# Patient Record
Sex: Male | Born: 1980 | Race: White | Hispanic: No | Marital: Married | Smoking: Current every day smoker
Health system: Southern US, Community
[De-identification: ages and names within clinical notes are randomized; demographics above are authoritative.]

## PROBLEM LIST (undated history)

## (undated) DIAGNOSIS — Z91018 Allergy to other foods: Secondary | ICD-10-CM

## (undated) DIAGNOSIS — Z789 Other specified health status: Secondary | ICD-10-CM

## (undated) HISTORY — DX: Other specified health status: Z78.9

## (undated) HISTORY — PX: CYSTECTOMY: SUR359

---

## 2004-08-11 ENCOUNTER — Ambulatory Visit: Payer: Self-pay | Admitting: Family Medicine

## 2005-03-10 ENCOUNTER — Ambulatory Visit: Payer: Self-pay | Admitting: Family Medicine

## 2005-03-18 ENCOUNTER — Ambulatory Visit: Payer: Self-pay | Admitting: Family Medicine

## 2006-02-01 ENCOUNTER — Ambulatory Visit: Payer: Self-pay | Admitting: Family Medicine

## 2006-02-08 ENCOUNTER — Ambulatory Visit: Payer: Self-pay | Admitting: Family Medicine

## 2006-03-02 ENCOUNTER — Ambulatory Visit: Payer: Self-pay | Admitting: Family Medicine

## 2006-07-05 ENCOUNTER — Ambulatory Visit: Payer: Self-pay | Admitting: Family Medicine

## 2006-08-09 ENCOUNTER — Ambulatory Visit: Payer: Self-pay | Admitting: Physician Assistant

## 2006-08-16 ENCOUNTER — Ambulatory Visit: Payer: Self-pay | Admitting: Family Medicine

## 2008-11-09 ENCOUNTER — Emergency Department (HOSPITAL_COMMUNITY): Admission: EM | Admit: 2008-11-09 | Discharge: 2008-11-09 | Payer: Self-pay | Admitting: Emergency Medicine

## 2011-03-29 ENCOUNTER — Emergency Department (HOSPITAL_COMMUNITY)
Admission: EM | Admit: 2011-03-29 | Discharge: 2011-03-29 | Disposition: A | Discharge status: Home / Self Care | Payer: Self-pay | Attending: Emergency Medicine | Admitting: Emergency Medicine

## 2011-03-29 DIAGNOSIS — R42 Dizziness and giddiness: Secondary | ICD-10-CM | POA: Insufficient documentation

## 2011-03-29 DIAGNOSIS — T148 Other injury of unspecified body region: Secondary | ICD-10-CM | POA: Insufficient documentation

## 2011-03-29 DIAGNOSIS — R110 Nausea: Secondary | ICD-10-CM | POA: Insufficient documentation

## 2011-03-29 DIAGNOSIS — E860 Dehydration: Secondary | ICD-10-CM | POA: Insufficient documentation

## 2011-03-29 DIAGNOSIS — W57XXXA Bitten or stung by nonvenomous insect and other nonvenomous arthropods, initial encounter: Secondary | ICD-10-CM | POA: Insufficient documentation

## 2011-03-29 LAB — GLUCOSE, CAPILLARY: Glucose-Capillary: 70 mg/dL (ref 70–99)

## 2012-03-03 ENCOUNTER — Encounter (HOSPITAL_COMMUNITY): Payer: Self-pay | Admitting: *Deleted

## 2012-03-03 ENCOUNTER — Emergency Department (HOSPITAL_COMMUNITY)
Admission: EM | Admit: 2012-03-03 | Discharge: 2012-03-04 | Disposition: A | Discharge status: Home / Self Care | Payer: Self-pay | Attending: Emergency Medicine | Admitting: Emergency Medicine

## 2012-03-03 ENCOUNTER — Emergency Department (HOSPITAL_COMMUNITY): Payer: Self-pay

## 2012-03-03 DIAGNOSIS — R42 Dizziness and giddiness: Secondary | ICD-10-CM | POA: Insufficient documentation

## 2012-03-03 DIAGNOSIS — R51 Headache: Secondary | ICD-10-CM | POA: Insufficient documentation

## 2012-03-03 LAB — URINALYSIS, ROUTINE W REFLEX MICROSCOPIC
Nitrite: NEGATIVE
Specific Gravity, Urine: 1.01 (ref 1.005–1.030)
Urobilinogen, UA: 0.2 mg/dL (ref 0.0–1.0)

## 2012-03-03 LAB — BASIC METABOLIC PANEL
GFR calc Af Amer: >90 mL/min (ref >90)
GFR calc non Af Amer: >90 mL/min (ref >90)
Potassium: 3.9 mEq/L (ref 3.5–5.1)
Sodium: 139 mEq/L (ref 135–145)

## 2012-03-03 LAB — DIFFERENTIAL
Basophils Absolute: 0 10*3/uL (ref 0.0–0.1)
Basophils Relative: 0 % (ref 0–1)
Eosinophils Absolute: 0 10*3/uL (ref 0.0–0.7)
Neutrophils Relative %: 65 % (ref 43–77)

## 2012-03-03 LAB — CBC
MCH: 31 pg (ref 26.0–34.0)
MCHC: 33.9 g/dL (ref 30.0–36.0)
Platelets: 232 10*3/uL (ref 150–400)
WBC: 9.1 10*3/uL (ref 4.0–10.5)

## 2012-03-03 NOTE — ED Provider Notes (Signed)
History     CSN: [id number]  Arrival date & time 03/03/12  2040   First MD Initiated Contact with Patient 03/03/12 2231      Chief Complaint  Patient presents with  . Dizziness    (Consider location/radiation/quality/duration/timing/severity/associated sxs/prior treatment) HPI Comments: Patient c/o intermittent dizziness for one year.  States he feels a sense of movement with change of position and sometimes feels "spinning" sensation with rest.  States he was seen at his PMD's office earlier this week for same and "they drew my blood" but states he does not know the results.  Yesterday, he reports having a sudden onset of headache to both temples and behind his eyes.  Describes the headache as throbbing and radiates to the back of his head,  and persistent since onset. He denies visual changes, weakness, numbness, fever, neck pain or stiffness, vomiting or difficulty standing.  He also denies any other evaluations for the dizziness  Patient is a 31 y.o. male presenting with neurologic complaint. The history is provided by the patient.  Neurologic Problem The primary symptoms include headaches and dizziness. Primary symptoms do not include syncope, loss of consciousness, altered mental status, seizures, visual change, paresthesias, focal weakness, loss of sensation, speech change, memory loss, fever, nausea or vomiting. The symptoms began yesterday. The symptoms are unchanged. The neurological symptoms are diffuse.  The headache began yesterday. The headache developed suddenly. Headache is a new problem. The headache is present continuously. Location/region(s) of the headache: bilateral (behind both eyes). The headache is associated with nothing. The headache is not associated with photophobia, eye pain, visual change, neck stiffness, paresthesias, weakness or loss of balance.  He describes the dizziness as a sensation of spinning and lightheadedness. The dizziness began more than 1 week ago. The  dizziness has been unchanged since its onset. Chronicity: intermittent. Associated with: movement. Dizziness does not occur with blurred vision, tinnitus, hearing loss, nausea, vomiting, weakness or diaphoresis.  Additional symptoms do not include neck stiffness, weakness, pain, lower back pain, leg pain, loss of balance, photophobia, hallucinations, nystagmus, taste disturbance, tinnitus, vertigo, anxiety or irritability. Medical issues do not include seizures, diabetes, hypertension or recent surgery. Workup history does not include MRI, CT scan, EEG, cerebral angiography, lumbar puncture, carotid ultrasound or cardiac workup. Procedure history comments: laboratory studies.    History reviewed. No pertinent past medical history.  History reviewed. No pertinent past surgical history.  No family history on file.  History  Substance Use Topics  . Smoking status: Current Everyday Smoker -- 0.5 packs/day  . Smokeless tobacco: Not on file  . Alcohol Use: No      Review of Systems  Constitutional: Negative for fever, diaphoresis, activity change, appetite change and irritability.  HENT: Negative for facial swelling, trouble swallowing, neck pain, neck stiffness and tinnitus.   Eyes: Negative for blurred vision, photophobia, pain and visual disturbance.  Respiratory: Negative for chest tightness and shortness of breath.   Cardiovascular: Negative for syncope.  Gastrointestinal: Negative for nausea, vomiting and abdominal pain.  Genitourinary: Negative for flank pain.  Musculoskeletal: Negative for back pain and gait problem.  Skin: Negative for color change.  Neurological: Positive for dizziness, light-headedness and headaches. Negative for vertigo, speech change, focal weakness, seizures, loss of consciousness, syncope, facial asymmetry, speech difficulty, weakness, numbness, paresthesias and loss of balance.  Psychiatric/Behavioral: Negative for hallucinations, memory loss, confusion,  decreased concentration and altered mental status.  All other systems reviewed and are negative.    Allergies  Review of  patient's allergies indicates no known allergies.  Home Medications  No current outpatient prescriptions on file.  BP 150/97  Pulse 92  Temp 98.2 F (36.8 C)  Resp 20  Ht 6' (1.829 m)  Wt 135 lb (61.236 kg)  BMI 18.31 kg/m2  SpO2 100%  Physical Exam  Nursing note and vitals reviewed. Constitutional: He is oriented to person, place, and time. He appears well-developed and well-nourished. No distress.  HENT:  Head: Normocephalic and atraumatic.  Right Ear: Tympanic membrane and ear canal normal. No mastoid tenderness. No hemotympanum.  Left Ear: Tympanic membrane and ear canal normal. No mastoid tenderness. No hemotympanum.  Mouth/Throat: Uvula is midline, oropharynx is clear and moist and mucous membranes are normal.  Eyes: Conjunctivae and EOM are normal. Pupils are equal, round, and reactive to light.  Neck: Normal range of motion. Neck supple.  Cardiovascular: Normal rate, regular rhythm, normal heart sounds and intact distal pulses.   No murmur heard. Pulmonary/Chest: Effort normal and breath sounds normal. No respiratory distress. He exhibits no tenderness.  Abdominal: Soft. Bowel sounds are normal. He exhibits no distension. There is no tenderness.  Musculoskeletal: He exhibits no edema and no tenderness.  Lymphadenopathy:    He has no cervical adenopathy.  Neurological: He is alert and oriented to person, place, and time. No cranial nerve deficit or sensory deficit. He exhibits normal muscle tone. He displays no seizure activity. Coordination and gait normal.  Reflex Scores:      Tricep reflexes are 2+ on the right side and 2+ on the left side.      Bicep reflexes are 2+ on the right side and 2+ on the left side.      Brachioradialis reflexes are 2+ on the right side and 2+ on the left side.      Patellar reflexes are 2+ on the right side and 2+ on  the left side.      Achilles reflexes are 2+ on the right side and 2+ on the left side. Skin: Skin is warm and dry.  Psychiatric: He has a normal mood and affect.    ED Course  Procedures (including critical care time)  Results for orders placed during the hospital encounter of 03/03/12  CBC      Component Value Range   WBC 9.1  4.0 - 10.5 (K/uL)   RBC 5.03  4.22 - 5.81 (MIL/uL)   Hemoglobin 15.6  13.0 - 17.0 (g/dL)   HCT 45.4  09.8 - 11.9 (%)   MCV 91.5  78.0 - 100.0 (fL)   MCH 31.0  26.0 - 34.0 (pg)   MCHC 33.9  30.0 - 36.0 (g/dL)   RDW 14.7  82.9 - 56.2 (%)   Platelets 232  150 - 400 (K/uL)  DIFFERENTIAL      Component Value Range   Neutrophils Relative 65  43 - 77 (%)   Neutro Abs 5.9  1.7 - 7.7 (K/uL)   Lymphocytes Relative 26  12 - 46 (%)   Lymphs Abs 2.3  0.7 - 4.0 (K/uL)   Monocytes Relative 9  3 - 12 (%)   Monocytes Absolute 0.8  0.1 - 1.0 (K/uL)   Eosinophils Relative 0  0 - 5 (%)   Eosinophils Absolute 0.0  0.0 - 0.7 (K/uL)   Basophils Relative 0  0 - 1 (%)   Basophils Absolute 0.0  0.0 - 0.1 (K/uL)  BASIC METABOLIC PANEL      Component Value Range   Sodium 139  135 - 145 (mEq/L)   Potassium 3.9  3.5 - 5.1 (mEq/L)   Chloride 101  96 - 112 (mEq/L)   CO2 28  19 - 32 (mEq/L)   Glucose, Bld 83  70 - 99 (mg/dL)   BUN 7  6 - 23 (mg/dL)   Creatinine, Ser 8.11  0.50 - 1.35 (mg/dL)   Calcium 9.6  8.4 - 91.4 (mg/dL)   GFR calc non Af Amer >90  >90 (mL/min)   GFR calc Af Amer >90  >90 (mL/min)  URINALYSIS, ROUTINE W REFLEX MICROSCOPIC      Component Value Range   Color, Urine YELLOW  YELLOW    APPearance CLEAR  CLEAR    Specific Gravity, Urine 1.010  1.005 - 1.030    pH 7.5  5.0 - 8.0    Glucose, UA NEGATIVE  NEGATIVE (mg/dL)   Hgb urine dipstick NEGATIVE  NEGATIVE    Bilirubin Urine NEGATIVE  NEGATIVE    Ketones, ur NEGATIVE  NEGATIVE (mg/dL)   Protein, ur NEGATIVE  NEGATIVE (mg/dL)   Urobilinogen, UA 0.2  0.0 - 1.0 (mg/dL)   Nitrite NEGATIVE  NEGATIVE     Leukocytes, UA NEGATIVE  NEGATIVE      Ct Head Wo Contrast  03/03/2012  *RADIOLOGY REPORT*  Clinical Data: Dizziness for the past year.  Blurry vision for the past day.  CT HEAD WITHOUT CONTRAST  Technique:  Contiguous axial images were obtained from the base of the skull through the vertex without contrast.  Comparison: Head CT 01/29/2008.  Findings: No acute intracranial abnormalities.  Specifically, no evidence of acute/subacute cerebral ischemia, no acute intracranial hemorrhage, no focal mass, mass effect, hydrocephalus or abnormal intra or extra-axial fluid collections.  Visualized paranasal sinuses and mastoids are well pneumatized.  No acute displaced skull fractures are identified.  IMPRESSION: 1.  No acute intracranial abnormalities. 2.  The appearance of brain is normal.  Original Report Authenticated By: Florencia Reasons, M.D.      MDM     Previous medical charts, nursing notes and vitals signs from this visit were reviewed by me   All laboratory results and/or imaging results performed on this visit, if applicable, were reviewed by me and discussed with the patient and/or parent as well as recommendation for follow-up    MEDICATIONS GIVEN IN ED:  none  Patient is resting comfortably, NAD.  Orthostatic vitals signs were reviewed by me and wnml.  No focal neuro deficits on exam.  Ambulates in the dept w/o difficulty.  Patient also seen by the EDP and care plan was discussed.  Headache was sudden onset but not described as severe or explosive in onset.  Patient agrees to f/u with his PMD for recheck.      PRESCRIPTIONS GIVEN AT DISCHARGE: meclizine   Pt stable in ED with no significant deterioration in condition. Pt feels improved after observation and/or treatment in ED. Patient / Family / Caregiver understand and agree with initial ED impression and plan with expectations set for ED visit.  Patient agrees to return to ED for any worsening symptoms       Tyquavious Gamel L.  Ulrick Methot, [place] 03/04/12 0021

## 2012-03-03 NOTE — ED Provider Notes (Signed)
Pt well appearing, no distress, reports headache started yesterday but never got to a severe level.  He is well appearing, ambulatory, no focal neuro deficits, doubt SAH at this time  Joya Gaskins, MD 03/03/12 2333

## 2012-03-03 NOTE — ED Notes (Signed)
C/o dizziness for a year, blurred vision and headache onset yesterday

## 2012-03-04 MED ORDER — MECLIZINE HCL 25 MG PO TABS: 25.0000 mg | ORAL_TABLET | Freq: Three times a day (TID) | ORAL | Status: AC | PRN

## 2012-03-04 MED ORDER — OXYCODONE-ACETAMINOPHEN 5-325 MG PO TABS
1.0000 | ORAL_TABLET | Freq: Once | ORAL | Status: AC
  Administered 2012-03-04: 1 via ORAL
  Filled 2012-03-04: qty 1

## 2012-03-04 NOTE — ED Provider Notes (Signed)
Medical screening examination/treatment/procedure(s) were conducted as a shared visit with non-physician practitioner(s) and myself.  I personally evaluated the patient during the encounter   Joya Gaskins, MD 03/04/12 [phone]

## 2012-03-04 NOTE — ED Notes (Signed)
Pt alert & oriented x4, stable gait. Pt given discharge instructions, paperwork & prescription(s). Patient instructed to stop at the registration desk to finish any additional paperwork. pt verbalized understanding. Pt left department w/ no further questions.  

## 2012-03-04 NOTE — Discharge Instructions (Signed)
Dizziness Dizziness is a common problem. It is a feeling of unsteadiness or lightheadedness. You may feel like you are about to faint. Dizziness can lead to injury if you stumble or fall. A person of any age group can suffer from dizziness, but dizziness is more common in older adults. CAUSES  Dizziness can be caused by many different things, including:  Middle ear problems.   Standing for too long.   Infections.   An allergic reaction.   Aging.   An emotional response to something, such as the sight of blood.   Side effects of medicines.   Fatigue.   Problems with circulation or blood pressure.   Excess use of alcohol, medicines, or illegal drug use.   Breathing too fast (hyperventilation).   An arrhythmia or problems with your heart rhythm.   Low red blood cell count (anemia).   Pregnancy.   Vomiting, diarrhea, fever, or other illnesses that cause dehydration.   Diseases or conditions such as Parkinson's disease, high blood pressure (hypertension), diabetes, and thyroid problems.   Exposure to extreme heat.  DIAGNOSIS  To find the cause of your dizziness, your caregiver may do a physical exam, lab tests, radiologic imaging scans, or an electrocardiography test (ECG).  TREATMENT  Treatment of dizziness depends on the cause of your symptoms and can vary greatly. HOME CARE INSTRUCTIONS   Drink enough fluids to keep your urine clear or pale yellow. This is especially important in very hot weather. In the elderly, it is also important in cold weather.   If your dizziness is caused by medicines, take them exactly as directed. When taking blood pressure medicines, it is especially important to get up slowly.   Rise slowly from chairs and steady yourself until you feel okay.   In the morning, first sit up on the side of the bed. When this seems okay, stand slowly while holding onto something until you know your balance is fine.   If you need to stand in one place for a  long time, be sure to move your legs often. Tighten and relax the muscles in your legs while standing.   If dizziness continues to be a problem, have someone stay with you for a day or two. Do this until you feel you are well enough to stay alone. Have the person call your caregiver if he or she notices changes in you that are concerning.   Do not drive or use heavy machinery if you feel dizzy.  SEEK IMMEDIATE MEDICAL CARE IF:   Your dizziness or lightheadedness gets worse.   You feel nauseous or vomit.   You develop problems with talking, walking, weakness, or using your arms, hands, or legs.   You are not thinking clearly or you have difficulty forming sentences. It may take a friend or family member to determine if your thinking is normal.   You develop chest pain, abdominal pain, shortness of breath, or sweating.   Your vision changes.   You notice any bleeding.   You have side effects from medicine that seems to be getting worse rather than better.  MAKE SURE YOU:   Understand these instructions.   Will watch your condition.   Will get help right away if you are not doing well or get worse.  Document Released: 03/10/2001 Document Revised: 09/03/2011 Document Reviewed: 04/03/2011 [employer] Patient Information 2012 [employer].Benign Positional Vertigo Vertigo means you feel like you or your surroundings are moving when they are not. Benign positional vertigo   is the most common form of vertigo. Benign means that the cause of your condition is not serious. Benign positional vertigo is more common in older adults. CAUSES  Benign positional vertigo is the result of an upset in the labyrinth system. This is an area in the middle ear that helps control your balance. This may be caused by a viral infection, head injury, or repetitive motion. However, often no specific cause is found. SYMPTOMS  Symptoms of benign positional vertigo occur when you move your head or eyes in different  directions. Some of the symptoms may include:  Loss of balance and falls.   Vomiting.   Blurred vision.   Dizziness.   Nausea.   Involuntary eye movements (nystagmus).  DIAGNOSIS  Benign positional vertigo is usually diagnosed by physical exam. If the specific cause of your benign positional vertigo is unknown, your caregiver may perform imaging tests, such as magnetic resonance imaging (MRI) or computed tomography (CT). TREATMENT  Your caregiver may recommend movements or procedures to correct the benign positional vertigo. Medicines such as meclizine, benzodiazepines, and medicines for nausea may be used to treat your symptoms. In rare cases, if your symptoms are caused by certain conditions that affect the inner ear, you may need surgery. HOME CARE INSTRUCTIONS   Follow your caregiver's instructions.   Move slowly. Do not make sudden body or head movements.   Avoid driving.   Avoid operating heavy machinery.   Avoid performing any tasks that would be dangerous to you or others during a vertigo episode.   Drink enough fluids to keep your urine clear or pale yellow.  SEEK IMMEDIATE MEDICAL CARE IF:   You develop problems with walking, weakness, numbness, or using your arms, hands, or legs.   You have difficulty speaking.   You develop severe headaches.   Your nausea or vomiting continues or gets worse.   You develop visual changes.   Your family or friends notice any behavioral changes.   Your condition gets worse.   You have a fever.   You develop a stiff neck or sensitivity to light.  MAKE SURE YOU:   Understand these instructions.   Will watch your condition.   Will get help right away if you are not doing well or get worse.  Document Released: 06/22/2006 Document Revised: 09/03/2011 Document Reviewed: 06/04/2011 [employer] Patient Information 2012 [employer]. 

## 2012-08-13 ENCOUNTER — Emergency Department (HOSPITAL_COMMUNITY)
Admission: EM | Admit: 2012-08-13 | Discharge: 2012-08-13 | Disposition: A | Discharge status: Home / Self Care | Payer: Self-pay | Attending: Emergency Medicine | Admitting: Emergency Medicine

## 2012-08-13 ENCOUNTER — Encounter (HOSPITAL_COMMUNITY): Payer: Self-pay

## 2012-08-13 DIAGNOSIS — K0889 Other specified disorders of teeth and supporting structures: Secondary | ICD-10-CM

## 2012-08-13 DIAGNOSIS — J029 Acute pharyngitis, unspecified: Secondary | ICD-10-CM | POA: Insufficient documentation

## 2012-08-13 DIAGNOSIS — K089 Disorder of teeth and supporting structures, unspecified: Secondary | ICD-10-CM | POA: Insufficient documentation

## 2012-08-13 DIAGNOSIS — F17200 Nicotine dependence, unspecified, uncomplicated: Secondary | ICD-10-CM | POA: Insufficient documentation

## 2012-08-13 LAB — RAPID STREP SCREEN (MED CTR MEBANE ONLY): Streptococcus, Group A Screen (Direct): NEGATIVE

## 2012-08-13 MED ORDER — AMOXICILLIN 500 MG PO CAPS: 500.0000 mg | ORAL_CAPSULE | Freq: Three times a day (TID) | ORAL | Status: DC

## 2012-08-13 MED ORDER — HYDROCODONE-ACETAMINOPHEN 5-325 MG PO TABS: ORAL_TABLET | ORAL | Status: DC

## 2012-08-13 NOTE — ED Notes (Signed)
Pt reports sore throat and ?toothache to left lower mouth

## 2012-08-15 NOTE — ED Provider Notes (Signed)
History     CSN: [id number]  Arrival date & time 08/13/12  1436   First MD Initiated Contact with Patient 08/13/12 1509      Chief Complaint  Patient presents with  . Sore Throat  . Dental Pain    (Consider location/radiation/quality/duration/timing/severity/associated sxs/prior treatment) Patient is a 31 y.o. male presenting with pharyngitis and tooth pain. The history is provided by the patient.  Sore Throat This is a new problem. The current episode started in the past 7 days. The problem occurs constantly. The problem has been unchanged. Associated symptoms include a sore throat. Pertinent negatives include no arthralgias, congestion, coughing, fever, headaches, neck pain, numbness, rash, swollen glands, visual change, vomiting or weakness. Associated symptoms comments: Dental pain. Exacerbated by: chewing  He has tried nothing for the symptoms. The treatment provided no relief.  Dental PainThe primary symptoms include sore throat. Primary symptoms do not include headaches, fever or cough.  The sore throat is not accompanied by trouble swallowing.  Additional symptoms do not include: facial swelling, trouble swallowing and swollen glands.    History reviewed. No pertinent past medical history.  History reviewed. No pertinent past surgical history.  No family history on file.  History  Substance Use Topics  . Smoking status: Current Every Day Smoker -- 0.5 packs/day    Types: Cigarettes  . Smokeless tobacco: Not on file  . Alcohol Use: No      Review of Systems  Constitutional: Negative for fever and appetite change.  HENT: Positive for sore throat and dental problem. Negative for congestion, facial swelling, trouble swallowing, neck pain and neck stiffness.   Eyes: Negative for pain and visual disturbance.  Respiratory: Negative for cough.   Gastrointestinal: Negative for vomiting.  Musculoskeletal: Negative for arthralgias.  Skin: Negative for rash.    Neurological: Negative for dizziness, facial asymmetry, weakness, numbness and headaches.  Hematological: Negative for adenopathy.  All other systems reviewed and are negative.    Allergies  Review of patient's allergies indicates no known allergies.  Home Medications   Current Outpatient Rx  Name  Route  Sig  Dispense  Refill  . FLUTICASONE PROPIONATE 50 MCG/ACT NA SUSP   Nasal   Place 2 sprays into the nose daily.         . AMOXICILLIN 500 MG PO CAPS   Oral   Take 1 capsule (500 mg total) by mouth 3 (three) times daily. For 10 days   30 capsule   0   . HYDROCODONE-ACETAMINOPHEN 5-325 MG PO TABS      Take one-two tabs po q 4-6 hrs prn pain   20 tablet   0     BP 145/82  Pulse 84  Temp 98 F (36.7 C) (Oral)  Resp 20  Ht 6' (1.829 m)  Wt 148 lb (67.132 kg)  BMI 20.07 kg/m2  SpO2 100%  Physical Exam  Nursing note and vitals reviewed. Constitutional: He is oriented to person, place, and time. He appears well-developed and well-nourished. No distress.  HENT:  Head: Normocephalic and atraumatic. No trismus in the jaw.  Right Ear: Tympanic membrane and ear canal normal.  Left Ear: Tympanic membrane and ear canal normal.  Mouth/Throat: Uvula is midline, oropharynx is clear and moist and mucous membranes are normal. Dental caries present. No dental abscesses or uvula swelling.       Multiple dental caries with mild erythema and ttp of the left lower premolars and molars.  No trismus or facial swelling  Neck:  Normal range of motion. Neck supple.  Cardiovascular: Normal rate, regular rhythm and normal heart sounds.   No murmur heard. Pulmonary/Chest: Effort normal and breath sounds normal.  Musculoskeletal: Normal range of motion.  Lymphadenopathy:    He has no cervical adenopathy.  Neurological: He is alert and oriented to person, place, and time. He exhibits normal muscle tone. Coordination normal.  Skin: Skin is warm and dry.    ED Course  Procedures  (including critical care time)  Results for orders placed during the hospital encounter of 08/13/12  RAPID STREP SCREEN      Component Value Range   Streptococcus, Group A Screen (Direct) NEGATIVE  NEGATIVE      1. Pain, dental       MDM   Vitals reviewed, pt is alert , well appearing  Multiple dental caries.  No facial swelling, trismus or dental abscess.  Pt agrees to f/u with a dentist.     Prescribed: Amoxil norco #20   Sigfredo Schreier L. Karlton Maya, [place] 08/15/12 1659

## 2012-08-15 NOTE — ED Provider Notes (Signed)
Medical screening examination/treatment/procedure(s) were performed by non-physician practitioner and as supervising physician I was immediately available for consultation/collaboration.   Haik Mahoney L Jagjit Riner, MD 08/15/12 2248 

## 2012-10-28 ENCOUNTER — Encounter (HOSPITAL_COMMUNITY): Payer: Self-pay | Admitting: *Deleted

## 2012-10-28 ENCOUNTER — Emergency Department (HOSPITAL_COMMUNITY)
Admission: EM | Admit: 2012-10-28 | Discharge: 2012-10-28 | Disposition: A | Discharge status: Home / Self Care | Payer: Self-pay | Attending: Emergency Medicine | Admitting: Emergency Medicine

## 2012-10-28 ENCOUNTER — Emergency Department (HOSPITAL_COMMUNITY): Payer: Self-pay

## 2012-10-28 DIAGNOSIS — F17200 Nicotine dependence, unspecified, uncomplicated: Secondary | ICD-10-CM | POA: Insufficient documentation

## 2012-10-28 DIAGNOSIS — R070 Pain in throat: Secondary | ICD-10-CM | POA: Insufficient documentation

## 2012-10-28 DIAGNOSIS — IMO0002 Reserved for concepts with insufficient information to code with codable children: Secondary | ICD-10-CM | POA: Insufficient documentation

## 2012-10-28 DIAGNOSIS — Z87828 Personal history of other (healed) physical injury and trauma: Secondary | ICD-10-CM | POA: Insufficient documentation

## 2012-10-28 DIAGNOSIS — K047 Periapical abscess without sinus: Secondary | ICD-10-CM | POA: Insufficient documentation

## 2012-10-28 DIAGNOSIS — K029 Dental caries, unspecified: Secondary | ICD-10-CM | POA: Insufficient documentation

## 2012-10-28 DIAGNOSIS — R1310 Dysphagia, unspecified: Secondary | ICD-10-CM | POA: Insufficient documentation

## 2012-10-28 LAB — RAPID STREP SCREEN (MED CTR MEBANE ONLY): Streptococcus, Group A Screen (Direct): NEGATIVE

## 2012-10-28 MED ORDER — AMOXICILLIN 500 MG PO CAPS: 500.0000 mg | ORAL_CAPSULE | Freq: Three times a day (TID) | ORAL | Status: AC

## 2012-10-28 MED ORDER — DEXTROSE 5 % IV SOLN
1.0000 g | Freq: Once | INTRAVENOUS | Status: AC
  Administered 2012-10-28: 1 g via INTRAVENOUS
  Filled 2012-10-28: qty 10

## 2012-10-28 MED ORDER — HYDROCODONE-ACETAMINOPHEN 5-325 MG PO TABS
2.0000 | ORAL_TABLET | Freq: Once | ORAL | Status: AC
  Administered 2012-10-28: 2 via ORAL
  Filled 2012-10-28: qty 2

## 2012-10-28 MED ORDER — IBUPROFEN 800 MG PO TABS
800.0000 mg | ORAL_TABLET | Freq: Once | ORAL | Status: AC
  Administered 2012-10-28: 800 mg via ORAL
  Filled 2012-10-28: qty 1

## 2012-10-28 MED ORDER — HYDROCODONE-ACETAMINOPHEN 5-325 MG PO TABS
1.0000 | ORAL_TABLET | Freq: Once | ORAL | Status: AC
  Administered 2012-10-28: 1 via ORAL
  Filled 2012-10-28: qty 1

## 2012-10-28 MED ORDER — IOHEXOL 300 MG/ML  SOLN
75.0000 mL | Freq: Once | INTRAMUSCULAR | Status: AC | PRN
  Administered 2012-10-28: 75 mL via INTRAVENOUS

## 2012-10-28 MED ORDER — HYDROCODONE-ACETAMINOPHEN 5-325 MG PO TABS: 1.0000 | ORAL_TABLET | ORAL | Status: DC | PRN

## 2012-10-28 MED ORDER — ONDANSETRON HCL 4 MG PO TABS
4.0000 mg | ORAL_TABLET | Freq: Once | ORAL | Status: AC
  Administered 2012-10-28: 4 mg via ORAL
  Filled 2012-10-28: qty 1

## 2012-10-28 MED ORDER — AMOXICILLIN 250 MG PO CAPS
500.0000 mg | ORAL_CAPSULE | Freq: Once | ORAL | Status: AC
  Administered 2012-10-28: 500 mg via ORAL
  Filled 2012-10-28: qty 2

## 2012-10-28 NOTE — ED Provider Notes (Signed)
History     CSN: [id number]  Arrival date & time 10/28/12  4098   First MD Initiated Contact with Patient 10/28/12 2015      No chief complaint on file.   (Consider location/radiation/quality/duration/timing/severity/associated sxs/prior treatment) HPI Comments: Pt states he has had problem with " bad teeth" for some time. He was seen in ED earlier today for toothache pain and treated with antibiotics and pain meds. He states that 3 hours ago, He began to have problem swallowing. Able to swallow yesterday, but not today. 9am started having pain. Can still get liquids down. No fever. No injury. No operations on neck or throat.  Throat pain  And pain just behind the chin. No difficulty with breathing.  The history is provided by the patient and the spouse.    History reviewed. No pertinent past medical history.  History reviewed. No pertinent past surgical history.  History reviewed. No pertinent family history.  History  Substance Use Topics  . Smoking status: Current Every Day Smoker -- 0.5 packs/day    Types: Cigarettes  . Smokeless tobacco: Not on file  . Alcohol Use: No      Review of Systems  Constitutional: Negative for activity change.       All ROS Neg except as noted in HPI  HENT: Positive for trouble swallowing and dental problem. Negative for nosebleeds and neck pain.   Eyes: Negative for photophobia and discharge.  Respiratory: Negative for cough, choking, shortness of breath, wheezing and stridor.   Cardiovascular: Negative for chest pain and palpitations.  Gastrointestinal: Negative for abdominal pain and blood in stool.  Genitourinary: Negative for dysuria, frequency and hematuria.  Musculoskeletal: Negative for back pain and arthralgias.  Skin: Negative.   Neurological: Negative for dizziness, seizures and speech difficulty.  Psychiatric/Behavioral: Negative for hallucinations and confusion.    Allergies  Review of patient's allergies indicates no  known allergies.  Home Medications   Current Outpatient Rx  Name  Route  Sig  Dispense  Refill  . AMOXICILLIN 500 MG PO CAPS   Oral   Take 1 capsule (500 mg total) by mouth 3 (three) times daily.   30 capsule   0   . HYDROCODONE-ACETAMINOPHEN 5-325 MG PO TABS      Take one-two tabs po q 4-6 hrs prn pain   20 tablet   0   . HYDROCODONE-ACETAMINOPHEN 5-325 MG PO TABS   Oral   Take 1 tablet by mouth every 4 (four) hours as needed for pain.   15 tablet   0     BP 139/74  Pulse 71  Temp 98.2 F (36.8 C) (Oral)  Resp 20  Wt 131 lb (59.421 kg)  SpO2 100%  Physical Exam  Nursing note and vitals reviewed. Constitutional: He is oriented to person, place, and time. He appears well-developed and well-nourished.  Non-toxic appearance.  HENT:  Head: Normocephalic.  Right Ear: Tympanic membrane and external ear normal.  Left Ear: Tympanic membrane and external ear normal.       Multiple pimples about the face consistent with acne. No drainage from any of the pimples. The veins are quite prominent under the tongue, but no significant swelling under the tongue appreciated. The uvula is slightly enlarged. The airway is patent. The speech is understandable.  Eyes: EOM and lids are normal. Pupils are equal, round, and reactive to light.  Neck: Normal range of motion. Neck supple. Carotid bruit is not present.       Submental  soreness noted. A few palpable lymph nodes.  Cardiovascular: Normal rate, regular rhythm, normal heart sounds, intact distal pulses and normal pulses.   Pulmonary/Chest: Breath sounds normal. No respiratory distress. He has no wheezes. He has no rales.  Abdominal: Soft. Bowel sounds are normal. There is no tenderness. There is no guarding.  Musculoskeletal: Normal range of motion.  Lymphadenopathy:       Head (right side): No submandibular adenopathy present.       Head (left side): No submandibular adenopathy present.    He has no cervical adenopathy.   Neurological: He is alert and oriented to person, place, and time. He has normal strength. No cranial nerve deficit or sensory deficit.  Skin: Skin is warm and dry.  Psychiatric: He has a normal mood and affect. His speech is normal.    ED Course  Procedures (including critical care time)  Labs Reviewed - No data to display No results found.   No diagnosis found.    MDM  I have reviewed nursing notes, vital signs, and all appropriate lab and imaging results for this patient. Strep screen negative. CT scan is read as unremarkable. Patient is treated with IV Rocephin in the event that the patient's infection is over taking the oral antibiotics being given. The patient will be asked to continue his ibuprofen his pain medication and his antibiotic. The patient is strongly encouraged to see a dentist as sone as possible. Patient invited to return to the emergency department if any additional changes, problems, or concerns.       Kathie Dike, [place] 10/29/12 [phone]

## 2012-10-28 NOTE — ED Notes (Addendum)
Pt says "trouble swallowing", seen here earlier for dental pain.  Alert, NAD

## 2012-10-28 NOTE — ED Provider Notes (Signed)
Medical screening examination/treatment/procedure(s) were performed by non-physician practitioner and as supervising physician I was immediately available for consultation/collaboration. Denym Rahimi, MD, FACEP   Kiondre Grenz L Sriram Febles, MD 10/28/12 1534 

## 2012-10-28 NOTE — ED Provider Notes (Signed)
History     CSN: [id number]  Arrival date & time 10/28/12  1350   First MD Initiated Contact with Patient 10/28/12 1439      Chief Complaint  Patient presents with  . Dental Pain    (Consider location/radiation/quality/duration/timing/severity/associated sxs/prior treatment) HPI Comments: Trevor Sosa is a 32 y.o. Male presenting with a 1 day history of dental pain and gingival swelling.   The patient has a history of injury to the tooth but also has generalized dental problems with he blames on drinking too much mountain dew.  He has a filling in his right lower 2nd molar which fell out some time ago and is now causing pain.  There has been no fevers,  Chills, nausea or vomiting, also no complaint of difficulty swallowing,  Although chewing makes pain worse.  The patient has tried tylenol without relief of symptoms.      The history is provided by the patient.    History reviewed. No pertinent past medical history.  History reviewed. No pertinent past surgical history.  History reviewed. No pertinent family history.  History  Substance Use Topics  . Smoking status: Current Every Day Smoker -- 0.5 packs/day    Types: Cigarettes  . Smokeless tobacco: Not on file  . Alcohol Use: No      Review of Systems  Constitutional: Negative for fever.  HENT: Positive for dental problem. Negative for sore throat, facial swelling, neck pain and neck stiffness.   Respiratory: Negative for shortness of breath.     Allergies  Review of patient's allergies indicates no known allergies.  Home Medications   Current Outpatient Rx  Name  Route  Sig  Dispense  Refill  . AMOXICILLIN 500 MG PO CAPS   Oral   Take 1 capsule (500 mg total) by mouth 3 (three) times daily. For 10 days   30 capsule   0   . AMOXICILLIN 500 MG PO CAPS   Oral   Take 1 capsule (500 mg total) by mouth 3 (three) times daily.   30 capsule   0   . FLUTICASONE PROPIONATE 50 MCG/ACT NA SUSP   Nasal   Place 2  sprays into the nose daily.         Marland Kitchen HYDROCODONE-ACETAMINOPHEN 5-325 MG PO TABS      Take one-two tabs po q 4-6 hrs prn pain   20 tablet   0   . HYDROCODONE-ACETAMINOPHEN 5-325 MG PO TABS   Oral   Take 1 tablet by mouth every 4 (four) hours as needed for pain.   15 tablet   0     BP 145/85  Pulse 82  Temp 98.4 F (36.9 C) (Oral)  Resp 20  Ht 6' (1.829 m)  Wt 131 lb 5 oz (59.563 kg)  BMI 17.81 kg/m2  SpO2 100%  Physical Exam  Constitutional: He is oriented to person, place, and time. He appears well-developed and well-nourished. No distress.  HENT:  Head: Normocephalic and atraumatic. No trismus in the jaw.    Right Ear: Tympanic membrane and external ear normal.  Left Ear: Tympanic membrane and external ear normal.  Mouth/Throat: Oropharynx is clear and moist and mucous membranes are normal. No oral lesions. Dental abscesses present.         Generalized dental decay and poor dental hygiene.    Eyes: Conjunctivae normal are normal.  Neck: Normal range of motion. Neck supple.  Cardiovascular: Normal rate and normal heart sounds.   Pulmonary/Chest: Effort  normal.  Abdominal: He exhibits no distension.  Musculoskeletal: Normal range of motion.  Lymphadenopathy:    He has no cervical adenopathy.  Neurological: He is alert and oriented to person, place, and time.  Skin: Skin is warm and dry. No erythema.  Psychiatric: He has a normal mood and affect.    ED Course  Procedures (including critical care time)  Labs Reviewed - No data to display No results found.   1. Dental abscess       MDM  Amoxil,  Hydrocodone,  F/u with dentist as planned,  pcp prn worse sx.        Burgess Amor, [place] 10/28/12 1514

## 2012-10-28 NOTE — ED Notes (Signed)
Rt jaw pain,,dental pain.

## 2012-10-30 NOTE — ED Provider Notes (Signed)
Medical screening examination/treatment/procedure(s) were performed by non-physician practitioner and as supervising physician I was immediately available for consultation/collaboration.   Leviathan Macera M Mayetta Castleman, DO 10/30/12 1910 

## 2013-03-17 ENCOUNTER — Encounter (HOSPITAL_COMMUNITY): Payer: Self-pay | Admitting: *Deleted

## 2013-03-17 ENCOUNTER — Emergency Department (HOSPITAL_COMMUNITY)
Admission: EM | Admit: 2013-03-17 | Discharge: 2013-03-17 | Disposition: A | Discharge status: Home / Self Care | Payer: Self-pay | Attending: Emergency Medicine | Admitting: Emergency Medicine

## 2013-03-17 DIAGNOSIS — F17200 Nicotine dependence, unspecified, uncomplicated: Secondary | ICD-10-CM | POA: Insufficient documentation

## 2013-03-17 DIAGNOSIS — F411 Generalized anxiety disorder: Secondary | ICD-10-CM | POA: Insufficient documentation

## 2013-03-17 DIAGNOSIS — R42 Dizziness and giddiness: Secondary | ICD-10-CM | POA: Insufficient documentation

## 2013-03-17 LAB — BASIC METABOLIC PANEL
Chloride: 98 mEq/L (ref 96–112)
Creatinine, Ser: 0.91 mg/dL (ref 0.50–1.35)
GFR calc Af Amer: >90 mL/min (ref >90)
GFR calc non Af Amer: >90 mL/min (ref >90)

## 2013-03-17 LAB — URINALYSIS, ROUTINE W REFLEX MICROSCOPIC
Bilirubin Urine: NEGATIVE
Ketones, ur: NEGATIVE mg/dL
Leukocytes, UA: NEGATIVE
Nitrite: NEGATIVE
Protein, ur: NEGATIVE mg/dL
Urobilinogen, UA: 0.2 mg/dL (ref 0.0–1.0)
pH: 7.5 (ref 5.0–8.0)

## 2013-03-17 LAB — CBC WITH DIFFERENTIAL/PLATELET
Basophils Absolute: 0 10*3/uL (ref 0.0–0.1)
Basophils Relative: 0 % (ref 0–1)
Eosinophils Absolute: 0 10*3/uL (ref 0.0–0.7)
HCT: 42.2 % (ref 39.0–52.0)
MCH: 30.3 pg (ref 26.0–34.0)
MCHC: 33.9 g/dL (ref 30.0–36.0)
Monocytes Absolute: 0.7 10*3/uL (ref 0.1–1.0)
Neutro Abs: 5.6 10*3/uL (ref 1.7–7.7)
Neutrophils Relative %: 70 % (ref 43–77)
RDW: 12.9 % (ref 11.5–15.5)

## 2013-03-17 NOTE — ED Notes (Signed)
Pt states he has been feeling nervous since yesterday with dizziness at times. Had the same occur two years ago and was treated for dehydration and tick bite, per pt. NAD.

## 2013-03-19 NOTE — ED Provider Notes (Signed)
History     CSN: [id number]  Arrival date & time 03/17/13  1029   First MD Initiated Contact with Patient 03/17/13 1034      Chief Complaint  Patient presents with  . Anxiety  . Dizziness    (Consider location/radiation/quality/duration/timing/severity/associated sxs/prior treatment) HPI Comments: Trevor Sosa is a 32 y.o. Male presenting with complaint of feeling nervous and having intermittent episodes of lightheadedness which are transient and triggered by standing too quickly, since yesterday afternoon.  He reports he has spent a lot of time at his home outdoors over the past several days, but not doing any exertional activities, but states the last time he felt this way he was treated for dehydration and treatment of the tick bite.  This occurred several years ago and was treated at an outside emergency department.  He denies any recent tick exposures.  He describes feeling "jittery" inside, but denies increased stress or anxiety and has had no difficulty sleeping and has maintained a good appetite.  He has been drinking plenty of fluids and has no reason to be dehydrated.  He denies fevers or rash, headache, nausea or emesis, shortness of breath chest pain, palpitations and abdominal pain.    The history is provided by the patient.    History reviewed. No pertinent past medical history.  History reviewed. No pertinent past surgical history.  No family history on file.  History  Substance Use Topics  . Smoking status: Current Every Day Smoker -- 0.50 packs/day    Types: Cigarettes  . Smokeless tobacco: Not on file  . Alcohol Use: No      Review of Systems  Constitutional: Negative for fever.  HENT: Negative for ear pain, congestion, sore throat, neck pain, tinnitus and ear discharge.   Eyes: Negative.   Respiratory: Negative for cough, chest tightness and shortness of breath.   Cardiovascular: Negative for chest pain, palpitations and leg swelling.   Gastrointestinal: Negative for nausea and abdominal pain.  Genitourinary: Negative.   Musculoskeletal: Negative for joint swelling and arthralgias.  Skin: Negative.  Negative for rash and wound.  Neurological: Positive for light-headedness. Negative for dizziness, weakness, numbness and headaches.  Psychiatric/Behavioral: Negative.  Negative for confusion and agitation. The patient is not nervous/anxious and is not hyperactive.     Allergies  Review of patient's allergies indicates no known allergies.  Home Medications  No current outpatient prescriptions on file.  BP 125/83  Pulse 81  Temp(Src) 98.5 F (36.9 C) (Oral)  Resp 16  Ht 6' (1.829 m)  Wt 130 lb (58.968 kg)  BMI 17.63 kg/m2  SpO2 100%  Physical Exam  Nursing note and vitals reviewed. Constitutional: He appears well-developed and well-nourished.  HENT:  Head: Normocephalic and atraumatic.  Right Ear: Tympanic membrane and external ear normal.  Left Ear: Tympanic membrane and external ear normal.  Mouth/Throat: Uvula is midline and oropharynx is clear and moist. Mucous membranes are not dry.  Eyes: Conjunctivae are normal.  Neck: Normal range of motion. No thyromegaly present.  Cardiovascular: Normal rate, regular rhythm, normal heart sounds and intact distal pulses.   Pulmonary/Chest: Effort normal and breath sounds normal. No respiratory distress. He has no decreased breath sounds. He has no wheezes. He has no rhonchi. He has no rales.  Abdominal: Soft. Bowel sounds are normal. There is no tenderness.  Musculoskeletal: Normal range of motion.  Neurological: He is alert.  Skin: Skin is warm and dry.  Psychiatric: He has a normal mood and affect.  ED Course  Procedures (including critical care time)  Labs Reviewed  BASIC METABOLIC PANEL - Abnormal; Notable for the following:    Glucose, Bld 105 (*)    All other components within normal limits  CBC WITH DIFFERENTIAL  URINALYSIS, ROUTINE W REFLEX  MICROSCOPIC   No results found.   1. Intermittent lightheadedness       MDM  Orthostatic vital signs were obtained and patient was not orthostatic with this procedure.  His laboratory tests were reviewed and unremarkable.  Exam is also unremarkable.  Vital signs stable during ED visit.  He tolerated by mouth fluids while here.  There is no clear medical diagnosis for patient's symptoms today.  He was given reassurance and advised to return for any worsened symptoms.  However, ideally was referred back to his PCP for further management and for recheck of his symptoms within the next week.  Patient states will call his doctor for a recheck next week.        Burgess Amor, PA-C 03/19/13 1742

## 2013-03-21 NOTE — ED Provider Notes (Signed)
Medical screening examination/treatment/procedure(s) were performed by non-physician practitioner and as supervising physician I was immediately available for consultation/collaboration.   Shelda Jakes, MD 03/21/13 [phone]

## 2013-12-01 ENCOUNTER — Encounter (HOSPITAL_COMMUNITY): Payer: Self-pay | Admitting: Emergency Medicine

## 2013-12-01 ENCOUNTER — Emergency Department (HOSPITAL_COMMUNITY)
Admission: EM | Admit: 2013-12-01 | Discharge: 2013-12-01 | Disposition: A | Discharge status: Home / Self Care | Payer: Self-pay | Attending: Emergency Medicine | Admitting: Emergency Medicine

## 2013-12-01 DIAGNOSIS — N342 Other urethritis: Secondary | ICD-10-CM | POA: Insufficient documentation

## 2013-12-01 DIAGNOSIS — N4889 Other specified disorders of penis: Secondary | ICD-10-CM | POA: Insufficient documentation

## 2013-12-01 DIAGNOSIS — F17200 Nicotine dependence, unspecified, uncomplicated: Secondary | ICD-10-CM | POA: Insufficient documentation

## 2013-12-01 LAB — URINALYSIS, ROUTINE W REFLEX MICROSCOPIC
BILIRUBIN URINE: NEGATIVE
Glucose, UA: NEGATIVE mg/dL
HGB URINE DIPSTICK: NEGATIVE
Ketones, ur: NEGATIVE mg/dL
Leukocytes, UA: NEGATIVE
Nitrite: NEGATIVE
PROTEIN: NEGATIVE mg/dL
Specific Gravity, Urine: 1.015 (ref 1.005–1.030)
UROBILINOGEN UA: 1 mg/dL (ref 0.0–1.0)
pH: 7.5 (ref 5.0–8.0)

## 2013-12-01 MED ORDER — AZITHROMYCIN 250 MG PO TABS
1000.0000 mg | ORAL_TABLET | Freq: Once | ORAL | Status: AC
  Administered 2013-12-01: 1000 mg via ORAL
  Filled 2013-12-01: qty 4

## 2013-12-01 MED ORDER — CEFTRIAXONE SODIUM 250 MG IJ SOLR
250.0000 mg | Freq: Once | INTRAMUSCULAR | Status: AC
  Administered 2013-12-01: 250 mg via INTRAMUSCULAR
  Filled 2013-12-01: qty 250

## 2013-12-01 MED ORDER — STERILE WATER FOR INJECTION IJ SOLN
INTRAMUSCULAR | Status: AC
  Administered 2013-12-01: 0.9 mL
  Filled 2013-12-01: qty 10

## 2013-12-01 NOTE — Discharge Instructions (Signed)
Urethritis, Adult Urethritis is an inflammation of the tube through which urine exits your bladder (urethra).  CAUSES Urethritis is often caused by an infection in your urethra. The infection can be viral, like herpes. The infection can also be bacterial, like gonorrhea. RISK FACTORS Risk factors of urethritis include:  Having sex without using a condom.  Having multiple sexual partners.  Having poor hygiene. SIGNS AND SYMPTOMS Symptoms of urethritis are less noticeable in women than in men. These symptoms include:  Burning feeling when you urinate (dysuria).  Discharge from your urethra.  Blood in your urine (hematuria).  Urinating more than usual. DIAGNOSIS  To confirm a diagnosis of urethritis, your health care provider will do the following:  Ask about your sexual history.  Perform a physical exam.  Have you provide a sample of your urine for lab testing.  Use a cotton swab to gently collect a sample from your urethra for lab testing. TREATMENT  It is important to treat urethritis. Depending on the cause, untreated urethritis may lead to serious genital infections and possibly infertility. Urethritis caused by a bacterial infection is treated with antibiotics. All sexual partners must be treated.  HOME CARE INSTRUCTIONS  Do not have sex until the test results are known and treatment is completed, even if your symptoms go away before you finish treatment.  Finish all medicines that you are prescribed. SEEK MEDICAL CARE IF:   Your symptoms are not improved in 3 days.  Your symptoms are getting worse.  You develop abdominal pain or pelvic pain (in women).  You develop joint pain. SEEK IMMEDIATE MEDICAL CARE IF:   You have a fever with a temperature of 101.71F (38.8C) or greater.  You have severe pain in the belly, back, or side.  You have repeated vomiting. Document Released: 03/10/2001 Document Revised: 07/05/2013 Document Reviewed: 05/15/2013 [facility]  Patient Information 2014 [employer], [place].   Followup with your primary care Dr. if not improving

## 2013-12-01 NOTE — Care Management Note (Signed)
ED/CM noted patient did not have health insurance and/or PCP listed in the computer.  Patient was given the [place] resource handout with information on the clinics, food pantries, and the handout for new health insurance sign-up.  Patient expressed appreciation for information received. 

## 2013-12-01 NOTE — ED Notes (Signed)
Dysuria,  Voiding less than usual  " once a day",   NO fever. No NVD

## 2013-12-01 NOTE — ED Provider Notes (Signed)
CSN: [id number]     Arrival date & time 12/01/13  1118 History   First MD Initiated Contact with Patient 12/01/13 1146    This chart was scribed for Donnetta HutchingBrian Jaquelyn Sakamoto, MD by Marica OtterNusrat Rahman, ED Scribe. This patient was seen in room APA09/APA09 and the patient's care was started at 11:56 AM.  Chief Complaint  Patient presents with  . Dysuria   The history is provided by the patient. No language interpreter was used.   HPI Comments: Trevor Sosa is a 33 y.o. male who presents to the Emergency Department complaining of intermittent dysuria onset a few days ago. Pt also complains of associated urinary retention. Pt describes feeling "uncomfortable" when he urinates. Pt denies any blood or mucus in his urine. Pt denies fever. Pt, however, states that there is some penile swelling. Pt denies having any history of similar symptoms.    History reviewed. No pertinent past medical history. History reviewed. No pertinent past surgical history. History reviewed. No pertinent family history. History  Substance Use Topics  . Smoking status: Current Every Day Smoker -- 0.50 packs/day    Types: Cigarettes  . Smokeless tobacco: Not on file  . Alcohol Use: No    Review of Systems  Constitutional: Negative for fever.  Genitourinary: Positive for dysuria, penile swelling and difficulty urinating.    A complete 10 system review of systems was obtained and all systems are negative except as noted in the HPI and PMH.    Allergies  Review of patient's allergies indicates no known allergies.  Home Medications   Current Outpatient Rx  Name  Route  Sig  Dispense  Refill  . acetaminophen (TYLENOL) 500 MG tablet   Oral   Take 500 mg by mouth every 6 (six) hours as needed for mild pain.          Triage Vitals: BP 121/71  Pulse 67  Temp(Src) 98.2 F (36.8 C) (Oral)  Resp 16  Ht 6' (1.829 m)  Wt 145 lb (65.772 kg)  BMI 19.66 kg/m2  SpO2 100% Physical Exam  Nursing note and vitals  reviewed. Constitutional: He is oriented to person, place, and time. He appears well-developed and well-nourished.  HENT:  Head: Normocephalic and atraumatic.  Eyes: Conjunctivae and EOM are normal. Pupils are equal, round, and reactive to light.  Neck: Normal range of motion. Neck supple.  Cardiovascular: Normal rate, regular rhythm and normal heart sounds.   Pulmonary/Chest: Effort normal and breath sounds normal.  Abdominal: Soft. Bowel sounds are normal.  Genitourinary: Penis normal.  Musculoskeletal: Normal range of motion.  Neurological: He is alert and oriented to person, place, and time.  Skin: Skin is warm and dry.  Psychiatric: He has a normal mood and affect. His behavior is normal.    ED Course  Procedures (including critical care time) DIAGNOSTIC STUDIES:  Oxygen Saturation is 100% on RA, normal by my interpretation.    COORDINATION OF CARE:  12:00 PM-Discussed treatment plan which includes UA with pt at bedside and pt agreed to plan.   Labs Review Labs Reviewed  URINALYSIS, ROUTINE W REFLEX MICROSCOPIC - Abnormal; Notable for the following:    APPearance CLOUDY (*)    All other components within normal limits   Imaging Review No results found.   EKG Interpretation None      MDM   Final diagnoses:  Urethritis   urinalysis shows no obvious infection. We'll treat for urethritis. Patient has primary care followup  I personally performed the services  described in this documentation, which was scribed in my presence. The recorded information has been reviewed and is accurate.     Donnetta Hutching, MD 12/01/13 [phone]

## 2014-02-22 IMAGING — CT CT NECK W/ CM
3 of 4 series · 12 of 33 positions shown, 14 images · IV contrast (Omnipaque 300)
Comparison: None.

CLINICAL DATA: Difficulty swallowing, right jaw/throat pain,
multiple dental caries

CT NECK WITH CONTRAST
TECHNIQUE: Multidetector CT imaging of the neck was performed with
intravenous contrast.
Contrast: 75mL OMNIPAQUE IOHEXOL 300 MG/ML  SOLN

[Series 2: soft tissue neck 2.0 b31s · axial · 0.39mm/px · z∈[+94,+260]mm · 4 of 125 slices shown, 5 images]
[im 21/125  soft-tissue]
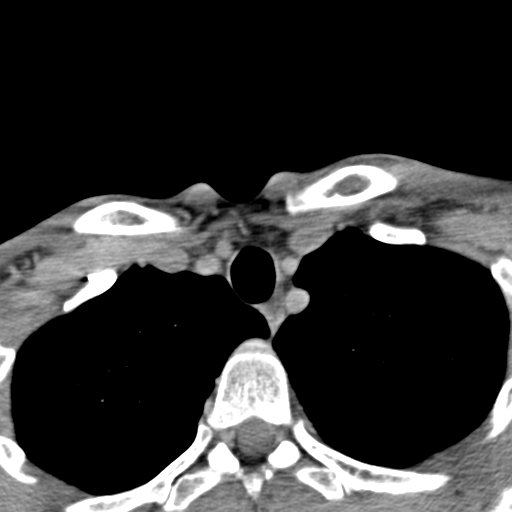
[im 21/125  bone]
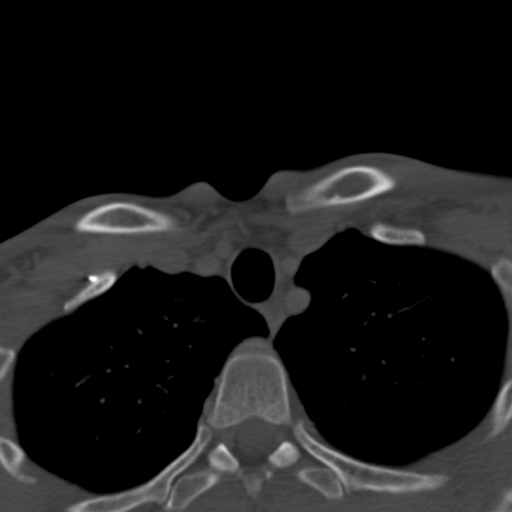
[im 42/125  bone]
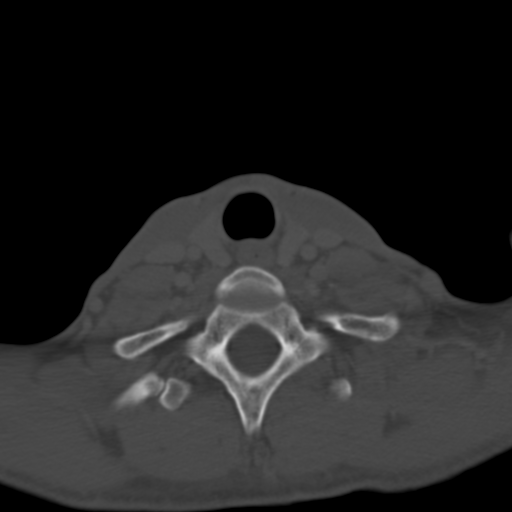
[im 83/125  bone]
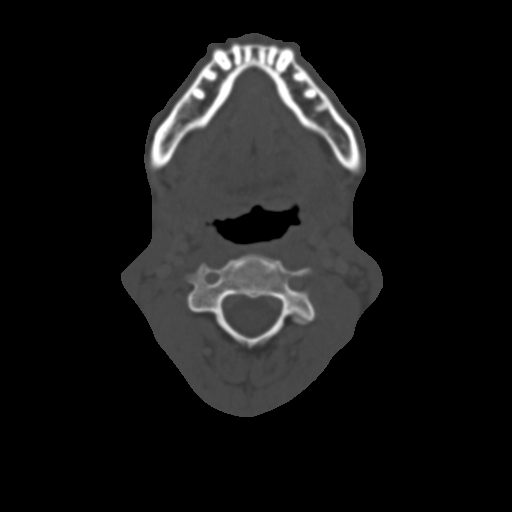
[im 104/125  bone]
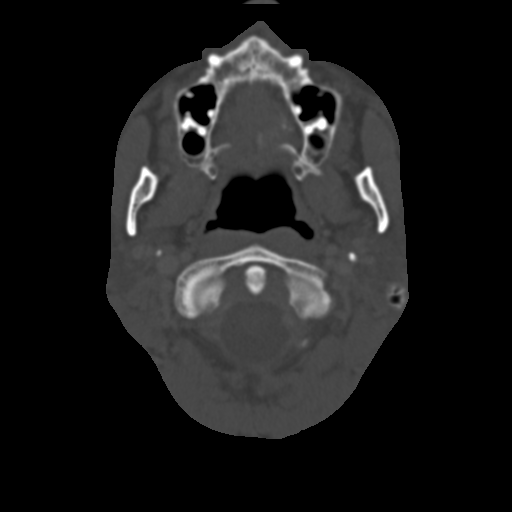

[Series 4: neck 2.0 soft tissue sag · sagittal · 0.34mm/px · 5 of 79 slices shown, 6 images]
[im 27/79  bone]
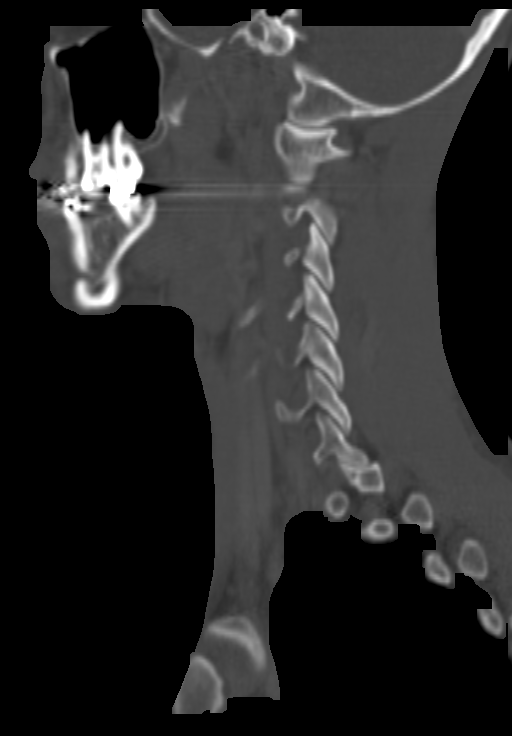
[im 33/79  bone]
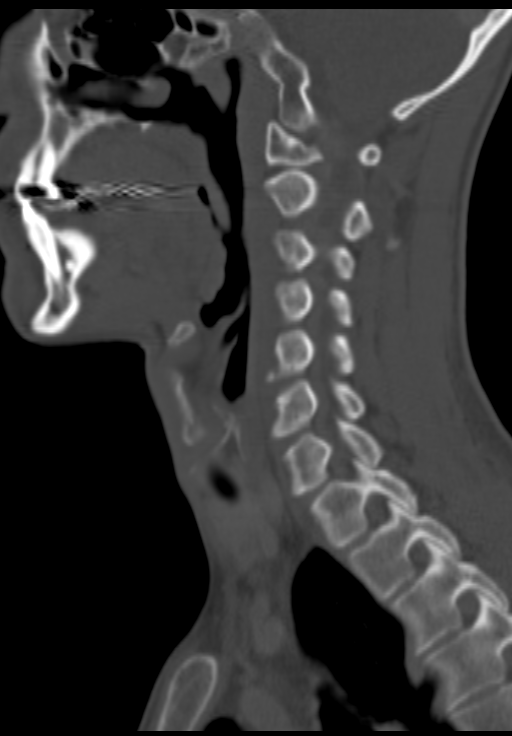
[im 40/79  soft-tissue]
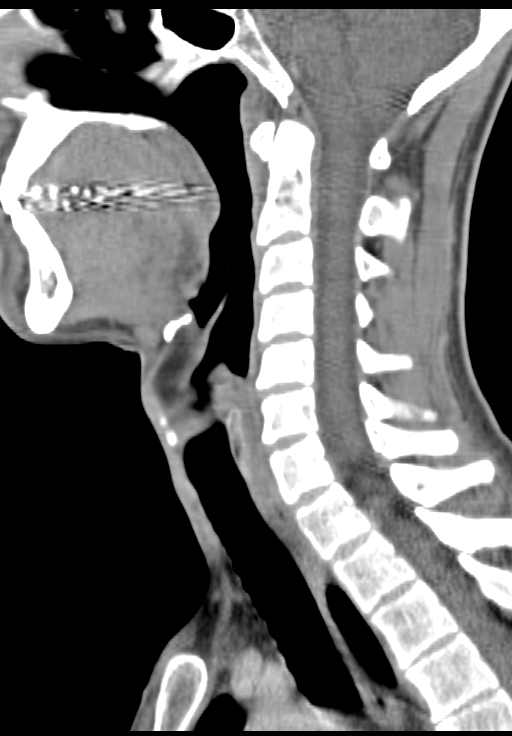
[im 40/79  bone]
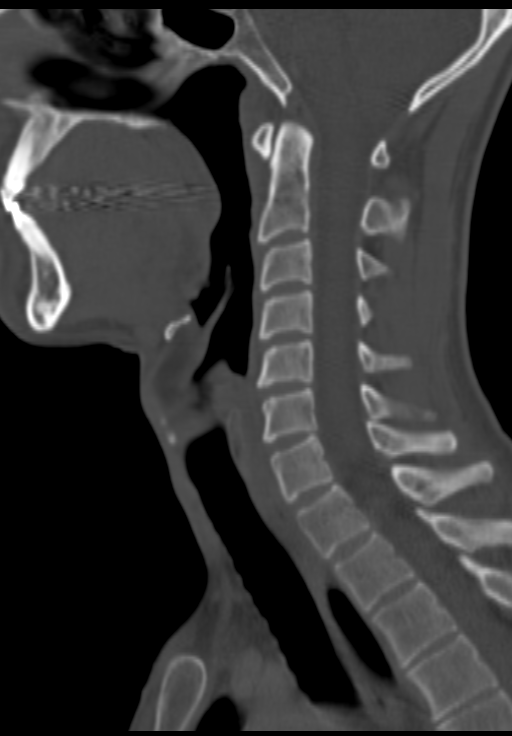
[im 46/79  bone]
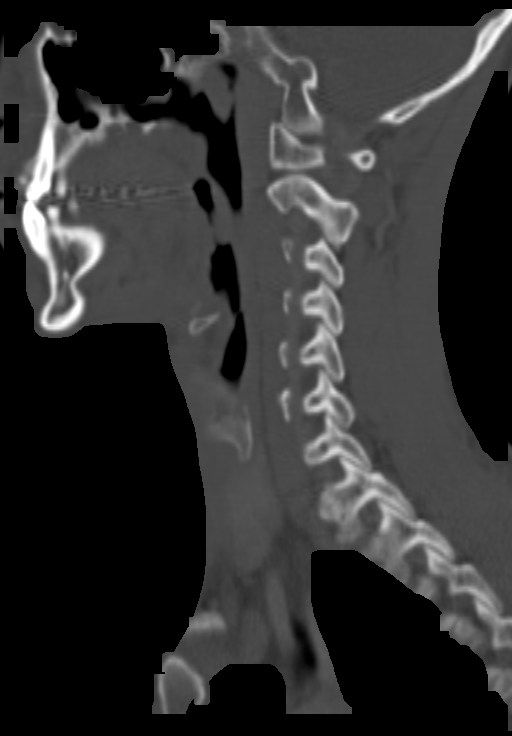
[im 53/79  bone]
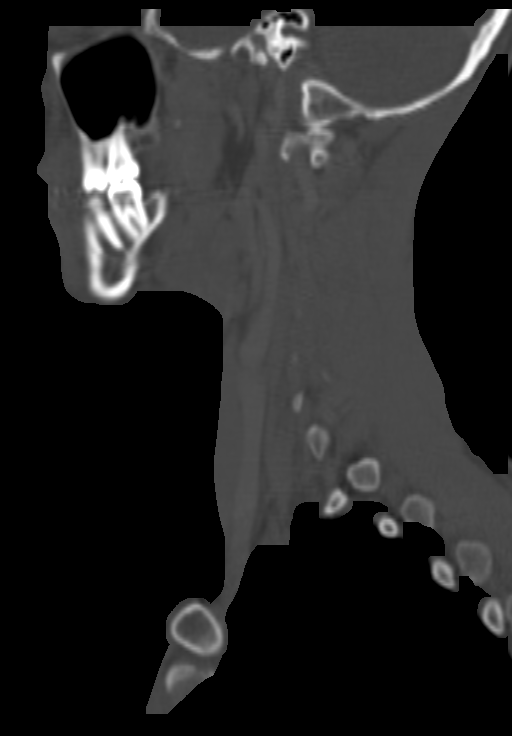

[Series 5: neck 2.0 soft tissue coro · coronal · 0.27mm/px · 3 of 90 slices shown]
[im 18/90  bone]
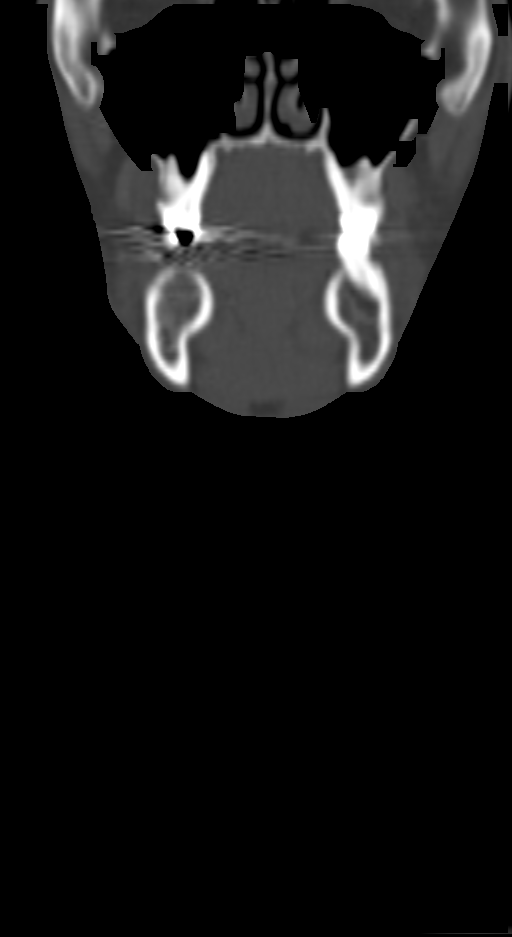
[im 36/90  bone]
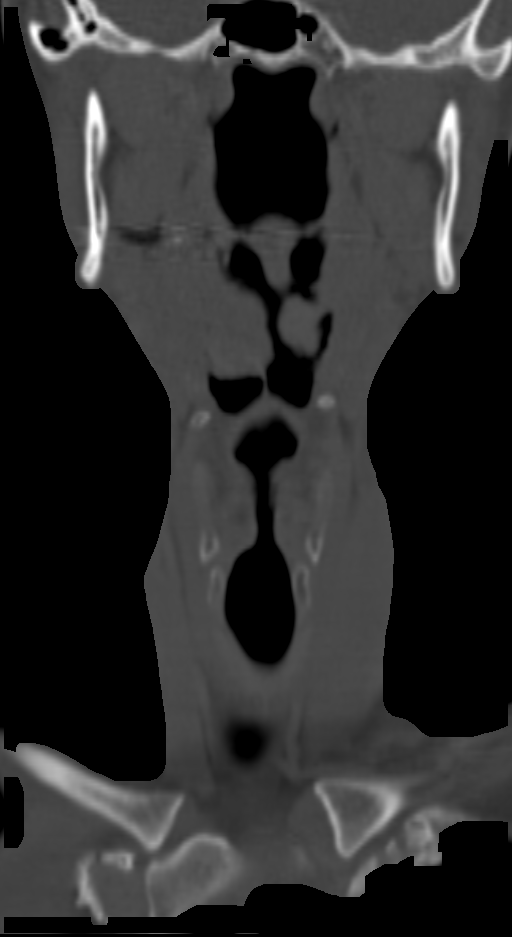
[im 54/90  bone]
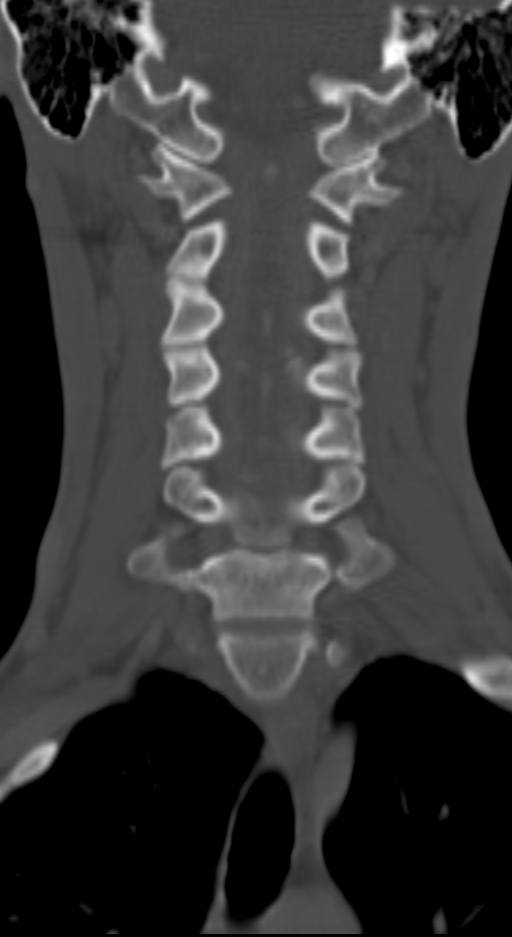

[12 of 33 positions shown; findings below may reference images not displayed]

FINDINGS: The nasopharyngeal soft tissues are unremarkable.

The airway remains patent.

No evidence of peritonsillar abscess.

Small bilateral cervical lymph nodes which do not meet pathologic
CT size criteria, likely reactive.

No evidence of odontogenic abscess.

Small cutaneous lesion/focal thickening overlying the right
mandible (series 2/image 43).

Thyroid is unremarkable.

The visualized paranasal sinuses are essentially clear. The mastoid
air cells are unopacified.

The visualized posterior fossa is unremarkable.

Visualized lung apices are clear.
IMPRESSION: Unremarkable CT neck.

## 2017-01-27 ENCOUNTER — Ambulatory Visit: Payer: Self-pay | Admitting: Physician Assistant

## 2017-01-28 ENCOUNTER — Encounter: Payer: Self-pay | Admitting: Physician Assistant

## 2017-02-04 ENCOUNTER — Encounter: Payer: Self-pay | Admitting: Physician Assistant

## 2017-02-04 ENCOUNTER — Ambulatory Visit: Payer: Self-pay | Admitting: Physician Assistant

## 2017-02-04 VITALS — BP 118/70 | HR 66 | Temp 98.1°F | Ht 71.0 in | Wt 132.0 lb

## 2017-02-04 DIAGNOSIS — F17210 Nicotine dependence, cigarettes, uncomplicated: Secondary | ICD-10-CM | POA: Insufficient documentation

## 2017-02-04 DIAGNOSIS — Z13220 Encounter for screening for lipoid disorders: Secondary | ICD-10-CM

## 2017-02-04 NOTE — Progress Notes (Signed)
   BP 118/70 (BP Location: Left Arm, Patient Position: Sitting, Cuff Size: Normal)   Pulse 66   Temp 98.1 F (36.7 C)   Ht 5\' 11"  (1.803 m)   Wt 132 lb (59.9 kg)   SpO2 98%   BMI 18.41 kg/m    Subjective:    Patient ID: Trevor Sosa, male    DOB: 1981-06-12, 36 y.o.   MRN: [id number]  HPI: Trevor Sosa is a 36 y.o. male presenting on 02/04/2017 for New Patient (Initial Visit)   HPI   Pt without complaints.  Relevant past medical, surgical, family and social history reviewed and updated as indicated. Interim medical history since our last visit reviewed. Allergies and medications reviewed and updated.  No current outpatient prescriptions on file.   Review of Systems  Constitutional: Negative for appetite change, chills, diaphoresis, fatigue, fever and unexpected weight change.  HENT: Positive for dental problem. Negative for congestion, drooling, ear pain, facial swelling, hearing loss, mouth sores, sneezing, sore throat, trouble swallowing and voice change.   Eyes: Negative for pain, discharge, redness, itching and visual disturbance.  Respiratory: Negative for cough, choking, shortness of breath and wheezing.   Cardiovascular: Negative for chest pain, palpitations and leg swelling.  Gastrointestinal: Negative for abdominal pain, blood in stool, constipation, diarrhea and vomiting.  Endocrine: Negative for cold intolerance, heat intolerance and polydipsia.  Genitourinary: Negative for decreased urine volume, dysuria and hematuria.  Musculoskeletal: Positive for arthralgias and back pain. Negative for gait problem.  Skin: Negative for rash.  Allergic/Immunologic: Negative for environmental allergies.  Neurological: Negative for seizures, syncope, light-headedness and headaches.  Hematological: Negative for adenopathy.  Psychiatric/Behavioral: Negative for agitation, dysphoric mood and suicidal ideas. The patient is not nervous/anxious.     Per HPI unless specifically  indicated above     Objective:    BP 118/70 (BP Location: Left Arm, Patient Position: Sitting, Cuff Size: Normal)   Pulse 66   Temp 98.1 F (36.7 C)   Ht 5\' 11"  (1.803 m)   Wt 132 lb (59.9 kg)   SpO2 98%   BMI 18.41 kg/m   Wt Readings from Last 3 Encounters:  02/04/17 132 lb (59.9 kg)  12/01/13 145 lb (65.8 kg)  03/17/13 130 lb (59 kg)    Physical Exam  Constitutional: He is oriented to person, place, and time. He appears well-developed and well-nourished.  HENT:  Head: Normocephalic and atraumatic.  Mouth/Throat: Oropharynx is clear and moist. No oropharyngeal exudate.  Eyes: Conjunctivae and EOM are normal. Pupils are equal, round, and reactive to light.  Neck: Neck supple. No thyromegaly present.  Cardiovascular: Normal rate and regular rhythm.   Pulmonary/Chest: Effort normal and breath sounds normal. He has no wheezes. He has no rales.  Abdominal: Soft. Bowel sounds are normal. He exhibits no mass. There is no hepatosplenomegaly. There is no tenderness.  Musculoskeletal: He exhibits no edema.  Lymphadenopathy:    He has no cervical adenopathy.  Neurological: He is alert and oriented to person, place, and time.  Skin: Skin is warm and dry. No rash noted.  Psychiatric: He has a normal mood and affect. His behavior is normal. Thought content normal.  Vitals reviewed.       Assessment & Plan:   Encounter Diagnoses  Name Primary?  . Cigarette nicotine dependence without complication Yes  . Screening cholesterol level     -will check baseline labs -pt counseled on smoking cessation -follow up one month to review labs.  RTO sooner prn

## 2017-02-04 NOTE — Patient Instructions (Signed)
Steps to Quit Smoking Smoking tobacco can be bad for your health. It can also affect almost every organ in your body. Smoking puts you and people around you at risk for many serious long-lasting (chronic) diseases. Quitting smoking is hard, but it is one of the best things that you can do for your health. It is never too late to quit. What are the benefits of quitting smoking? When you quit smoking, you lower your risk for getting serious diseases and conditions. They can include:  Lung cancer or lung disease.  Heart disease.  Stroke.  Heart attack.  Not being able to have children (infertility).  Weak bones (osteoporosis) and broken bones (fractures). If you have coughing, wheezing, and shortness of breath, those symptoms may get better when you quit. You may also get sick less often. If you are pregnant, quitting smoking can help to lower your chances of having a baby of low birth weight. What can I do to help me quit smoking? Talk with your doctor about what can help you quit smoking. Some things you can do (strategies) include:  Quitting smoking totally, instead of slowly cutting back how much you smoke over a period of time.  Going to in-person counseling. You are more likely to quit if you go to many counseling sessions.  Using resources and support systems, such as:  Online chats with a counselor.  Phone quitlines.  Printed self-help materials.  Support groups or group counseling.  Text messaging programs.  Mobile phone apps or applications.  Taking medicines. Some of these medicines may have nicotine in them. If you are pregnant or breastfeeding, do not take any medicines to quit smoking unless your doctor says it is okay. Talk with your doctor about counseling or other things that can help you. Talk with your doctor about using more than one strategy at the same time, such as taking medicines while you are also going to in-person counseling. This can help make quitting  easier. What things can I do to make it easier to quit? Quitting smoking might feel very hard at first, but there is a lot that you can do to make it easier. Take these steps:  Talk to your family and friends. Ask them to support and encourage you.  Call phone quitlines, reach out to support groups, or work with a counselor.  Ask people who smoke to not smoke around you.  Avoid places that make you want (trigger) to smoke, such as:  Bars.  Parties.  Smoke-break areas at work.  Spend time with people who do not smoke.  Lower the stress in your life. Stress can make you want to smoke. Try these things to help your stress:  Getting regular exercise.  Deep-breathing exercises.  Yoga.  Meditating.  Doing a body scan. To do this, close your eyes, focus on one area of your body at a time from head to toe, and notice which parts of your body are tense. Try to relax the muscles in those areas.  Download or buy apps on your mobile phone or tablet that can help you stick to your quit plan. There are many free apps, such as QuitGuide from the CDC (Centers for Disease Control and Prevention). You can find more support from smokefree.gov and other websites. This information is not intended to replace advice given to you by your health care provider. Make sure you discuss any questions you have with your health care provider. Document Released: 07/11/2009 Document Revised: 05/12/2016 Document   Reviewed: 01/29/2015 Elsevier Interactive Patient Education  2017 Elsevier Inc.  

## 2017-02-11 ENCOUNTER — Other Ambulatory Visit (HOSPITAL_COMMUNITY)
Admission: RE | Admit: 2017-02-11 | Discharge: 2017-02-11 | Disposition: A | Discharge status: Home / Self Care | Payer: Self-pay | Source: Ambulatory Visit | Attending: Physician Assistant | Admitting: Physician Assistant

## 2017-02-11 LAB — COMPREHENSIVE METABOLIC PANEL
ALBUMIN: 4.5 g/dL (ref 3.5–5.0)
ALT: 18 U/L (ref 17–63)
AST: 23 U/L (ref 15–41)
Alkaline Phosphatase: 68 U/L (ref 38–126)
Anion gap: 6 (ref 5–15)
BILIRUBIN TOTAL: 0.7 mg/dL (ref 0.3–1.2)
BUN: 13 mg/dL (ref 6–20)
CALCIUM: 9.4 mg/dL (ref 8.9–10.3)
CO2: 30 mmol/L (ref 22–32)
CREATININE: 0.9 mg/dL (ref 0.61–1.24)
Chloride: 104 mmol/L (ref 101–111)
GFR calc Af Amer: >60 mL/min (ref >60)
GLUCOSE: 87 mg/dL (ref 65–99)
POTASSIUM: 4.5 mmol/L (ref 3.5–5.1)
Sodium: 140 mmol/L (ref 135–145)
TOTAL PROTEIN: 7.3 g/dL (ref 6.5–8.1)

## 2017-02-11 LAB — HEMOGLOBIN AND HEMATOCRIT, BLOOD
HCT: 43.4 % (ref 39.0–52.0)
HEMOGLOBIN: 14.6 g/dL (ref 13.0–17.0)

## 2017-02-11 LAB — TSH: TSH: 1.171 u[IU]/mL (ref 0.350–4.500)

## 2017-02-11 LAB — LIPID PANEL
CHOL/HDL RATIO: 2.8 ratio
CHOLESTEROL: 190 mg/dL (ref 0–200)
HDL: 68 mg/dL (ref >40)
LDL Cholesterol: 113 mg/dL — ABNORMAL HIGH (ref 0–99)
Triglycerides: 46 mg/dL (ref <150)
VLDL: 9 mg/dL (ref 0–40)

## 2017-03-15 ENCOUNTER — Ambulatory Visit: Payer: Self-pay | Admitting: Physician Assistant

## 2017-03-16 ENCOUNTER — Ambulatory Visit: Payer: Self-pay | Admitting: Physician Assistant

## 2017-04-12 ENCOUNTER — Ambulatory Visit: Payer: Self-pay | Admitting: Physician Assistant

## 2017-04-14 ENCOUNTER — Encounter: Payer: Self-pay | Admitting: Physician Assistant

## 2017-04-14 ENCOUNTER — Ambulatory Visit: Payer: Self-pay | Admitting: Physician Assistant

## 2017-04-14 VITALS — BP 108/66 | HR 66 | Temp 98.1°F | Ht 71.0 in | Wt 133.0 lb

## 2017-04-14 DIAGNOSIS — E785 Hyperlipidemia, unspecified: Secondary | ICD-10-CM

## 2017-04-14 DIAGNOSIS — F17210 Nicotine dependence, cigarettes, uncomplicated: Secondary | ICD-10-CM

## 2017-04-14 NOTE — Progress Notes (Signed)
BP 108/66 (BP Location: Left Arm, Patient Position: Sitting, Cuff Size: Normal)   Pulse 66   Temp 98.1 F (36.7 C) (Other (Comment))   Ht 5\' 11"  (1.803 m)   Wt 133 lb (60.3 kg)   SpO2 98%   BMI 18.55 kg/m    Subjective:    Patient ID: Trevor Sosa, male    DOB: [birth year], 36 y.o.   MRN: [id number]  HPI: Trevor Sosa is a 36 y.o. male presenting on 04/14/2017 for Follow-up   HPI   Pt says he is doing well and has no complaints.  He is still smoking.   Relevant past medical, surgical, family and social history reviewed and updated as indicated. Interim medical history since our last visit reviewed. Allergies and medications reviewed and updated.  No current outpatient prescriptions on file.   Review of Systems  Constitutional: Positive for chills. Negative for appetite change, diaphoresis, fatigue, fever and unexpected weight change.  HENT: Positive for dental problem. Negative for congestion, drooling, ear pain, facial swelling, hearing loss, mouth sores, sneezing, sore throat, trouble swallowing and voice change.   Eyes: Negative for pain, discharge, redness, itching and visual disturbance.  Respiratory: Negative for cough, choking, shortness of breath and wheezing.   Cardiovascular: Negative for chest pain, palpitations and leg swelling.  Gastrointestinal: Negative for abdominal pain, blood in stool, constipation, diarrhea and vomiting.  Endocrine: Negative for cold intolerance, heat intolerance and polydipsia.  Genitourinary: Negative for decreased urine volume, dysuria and hematuria.  Musculoskeletal: Negative for arthralgias, back pain and gait problem.  Skin: Negative for rash.  Allergic/Immunologic: Negative for environmental allergies.  Neurological: Negative for seizures, syncope, light-headedness and headaches.  Hematological: Negative for adenopathy.  Psychiatric/Behavioral: Negative for agitation, dysphoric mood and suicidal ideas. The patient is not  nervous/anxious.     Per HPI unless specifically indicated above     Objective:    BP 108/66 (BP Location: Left Arm, Patient Position: Sitting, Cuff Size: Normal)   Pulse 66   Temp 98.1 F (36.7 C) (Other (Comment))   Ht 5\' 11"  (1.803 m)   Wt 133 lb (60.3 kg)   SpO2 98%   BMI 18.55 kg/m   Wt Readings from Last 3 Encounters:  04/14/17 133 lb (60.3 kg)  02/04/17 132 lb (59.9 kg)  12/01/13 145 lb (65.8 kg)    Physical Exam  Constitutional: He is oriented to person, place, and time. He appears well-developed and well-nourished.  HENT:  Head: Normocephalic and atraumatic.  Neck: Neck supple.  Cardiovascular: Normal rate and regular rhythm.   Pulmonary/Chest: Effort normal and breath sounds normal. He has no wheezes.  Abdominal: Soft. Bowel sounds are normal. There is no hepatosplenomegaly. There is no tenderness.  Musculoskeletal: He exhibits no edema.  Lymphadenopathy:    He has no cervical adenopathy.  Neurological: He is alert and oriented to person, place, and time.  Skin: Skin is warm and dry.  Psychiatric: He has a normal mood and affect. His behavior is normal.  Vitals reviewed.   Results for orders placed or performed during the hospital encounter of 02/11/17  Comprehensive metabolic panel  Result Value Ref Range   Sodium 140 135 - 145 mmol/L   Potassium 4.5 3.5 - 5.1 mmol/L   Chloride 104 101 - 111 mmol/L   CO2 30 22 - 32 mmol/L   Glucose, Bld 87 65 - 99 mg/dL   BUN 13 6 - 20 mg/dL   Creatinine, Ser 7.82 0.61 - 1.24 mg/dL  Calcium 9.4 8.9 - 10.3 mg/dL   Total Protein 7.3 6.5 - 8.1 g/dL   Albumin 4.5 3.5 - 5.0 g/dL   AST 23 15 - 41 U/L   ALT 18 17 - 63 U/L   Alkaline Phosphatase 68 38 - 126 U/L   Total Bilirubin 0.7 0.3 - 1.2 mg/dL   GFR calc non Af Amer >60 >60 mL/min   GFR calc Af Amer >60 >60 mL/min   Anion gap 6 5 - 15  Lipid panel  Result Value Ref Range   Cholesterol 190 0 - 200 mg/dL   Triglycerides 46 <213<150 mg/dL   HDL 68 >08>40 mg/dL   Total  CHOL/HDL Ratio 2.8 RATIO   VLDL 9 0 - 40 mg/dL   LDL Cholesterol 657113 (H) 0 - 99 mg/dL  TSH  Result Value Ref Range   TSH 1.171 0.350 - 4.500 uIU/mL  Hemoglobin and hematocrit, blood  Result Value Ref Range   Hemoglobin 14.6 13.0 - 17.0 g/dL   HCT 84.643.4 96.239.0 - 95.252.0 %      Assessment & Plan:    Encounter Diagnoses  Name Primary?  . Cigarette nicotine dependence without complication Yes  . Hyperlipidemia, unspecified hyperlipidemia type     -reviewed labs with pt -encouraged low-fat diet for lipids and gave handout -counseled on smoking cessation -pt to follow up in one year.  RTO sooner prn

## 2017-04-14 NOTE — Patient Instructions (Signed)
Fat and Cholesterol Restricted Diet High levels of fat and cholesterol in your blood may lead to various health problems, such as diseases of the heart, blood vessels, gallbladder, liver, and pancreas. Fats are concentrated sources of energy that come in various forms. Certain types of fat, including saturated fat, may be harmful in excess. Cholesterol is a substance needed by your body in small amounts. Your body makes all the cholesterol it needs. Excess cholesterol comes from the food you eat. When you have high levels of cholesterol and saturated fat in your blood, health problems can develop because the excess fat and cholesterol will gather along the walls of your blood vessels, causing them to narrow. Choosing the right foods will help you control your intake of fat and cholesterol. This will help keep the levels of these substances in your blood within normal limits and reduce your risk of disease. What is my plan? Your health care provider recommends that you:  Limit your fat intake to ______% or less of your total calories per day.  Limit the amount of cholesterol in your diet to less than _________mg per day.  Eat 20-30 grams of fiber each day.  What types of fat should I choose?  Choose healthy fats more often. Choose monounsaturated and polyunsaturated fats, such as olive and canola oil, flaxseeds, walnuts, almonds, and seeds.  Eat more omega-3 fats. Good choices include salmon, mackerel, sardines, tuna, flaxseed oil, and ground flaxseeds. Aim to eat fish at least two times a week.  Limit saturated fats. Saturated fats are primarily found in animal products, such as meats, butter, and cream. Plant sources of saturated fats include palm oil, palm kernel oil, and coconut oil.  Avoid foods with partially hydrogenated oils in them. These contain trans fats. Examples of foods that contain trans fats are stick margarine, some tub margarines, cookies, crackers, and other baked goods. What  general guidelines do I need to follow? These guidelines for healthy eating will help you control your intake of fat and cholesterol:  Check food labels carefully to identify foods with trans fats or high amounts of saturated fat.  Fill one half of your plate with vegetables and green salads.  Fill one fourth of your plate with whole grains. Look for the word "whole" as the first word in the ingredient list.  Fill one fourth of your plate with lean protein foods.  Limit fruit to two servings a day. Choose fruit instead of juice.  Eat more foods that contain fiber, such as apples, broccoli, carrots, beans, peas, and barley.  Eat more home-cooked food and less restaurant, buffet, and fast food.  Limit or avoid alcohol.  Limit foods high in starch and sugar.  Limit fried foods.  Cook foods using methods other than frying. Baking, boiling, grilling, and broiling are all great options.  Lose weight if you are overweight. Losing just 5-10% of your initial body weight can help your overall health and prevent diseases such as diabetes and heart disease.  What foods can I eat? Grains  Whole grains, such as whole wheat or whole grain breads, crackers, cereals, and pasta. Unsweetened oatmeal, bulgur, barley, quinoa, or brown rice. Corn or whole wheat flour tortillas. Vegetables  Fresh or frozen vegetables (raw, steamed, roasted, or grilled). Green salads. Fruits  All fresh, canned (in natural juice), or frozen fruits. Meats and other protein foods  Ground beef (85% or leaner), grass-fed beef, or beef trimmed of fat. Skinless chicken or turkey. Ground chicken or turkey.   Pork trimmed of fat. All fish and seafood. Eggs. Dried beans, peas, or lentils. Unsalted nuts or seeds. Unsalted canned or dry beans. Dairy  Low-fat dairy products, such as skim or 1% milk, 2% or reduced-fat cheeses, low-fat ricotta or cottage cheese, or plain low-fat yo Fats and oils  Tub margarines without trans  fats. Light or reduced-fat mayonnaise and salad dressings. Avocado. Olive, canola, sesame, or safflower oils. Natural peanut or almond butter (choose ones without added sugar and oil). The items listed above may not be a complete list of recommended foods or beverages. Contact your dietitian for more options. Foods to avoid Grains  White bread. White pasta. White rice. Cornbread. Bagels, pastries, and croissants. Crackers that contain trans fat. Vegetables  White potatoes. Corn. Creamed or fried vegetables. Vegetables in a cheese sauce. Fruits  Dried fruits. Canned fruit in light or heavy syrup. Fruit juice. Meats and other protein foods  Fatty cuts of meat. Ribs, chicken wings, bacon, sausage, bologna, salami, chitterlings, fatback, hot dogs, bratwurst, and packaged luncheon meats. Liver and organ meats. Dairy  Whole or 2% milk, cream, half-and-half, and cream cheese. Whole milk cheeses. Whole-fat or sweetened yogurt. Full-fat cheeses. Nondairy creamers and whipped toppings. Processed cheese, cheese spreads, or cheese curds. Beverages  Alcohol. Sweetened drinks (such as sodas, lemonade, and fruit drinks or punches). Fats and oils  Butter, stick margarine, lard, shortening, ghee, or bacon fat. Coconut, palm kernel, or palm oils. Sweets and desserts  Corn syrup, sugars, honey, and molasses. Candy. Jam and jelly. Syrup. Sweetened cereals. Cookies, pies, cakes, donuts, muffins, and ice cream. The items listed above may not be a complete list of foods and beverages to avoid. Contact your dietitian for more information. This information is not intended to replace advice given to you by your health care provider. Make sure you discuss any questions you have with your health care provider. Document Released: 09/14/2005 Document Revised: 10/05/2014 Document Reviewed: 12/13/2013 Elsevier Interactive Patient Education  2017 Elsevier Inc.  

## 2017-05-28 NOTE — Congregational Nurse Program (Signed)
Congregational Nurse Program Note  Date of Encounter: 05/27/2017  Past Medical History: No past medical history on file.  Encounter Details:     CNP Questionnaire - 05/27/17 1350      Patient Demographics   Is this a new or existing patient? Existing   Patient is considered a/an Not Applicable   Race Caucasian/White     Patient Assistance   Location of Patient Assistance LOT [id number]   Patient's financial/insurance status Low Income   Uninsured Patient ([place]) No   Patient referred to apply for the following financial assistance Not Applicable   Food insecurities addressed Provided food supplies   Transportation assistance No   Assistance securing medications No   Educational health offerings Hypertension;Nutrition;Safety     Encounter Details   Primary purpose of visit Education/Health Concerns   Was an Emergency Department visit averted? Not Applicable   Does patient have a medical provider? Yes  Health Dept.   Patient referred to Not Applicable   Was a mental health screening completed? (GAINS tool) No   Does patient have dental issues? No   Does patient have vision issues? No   Does your patient have an abnormal blood pressure today? No   Since previous encounter, have you referred patient for abnormal blood pressure that resulted in a new diagnosis or medication change? No   Does your patient have an abnormal blood glucose today? No   Since previous encounter, have you referred patient for abnormal blood glucose that resulted in a new diagnosis or medication change? No   Was there a life-saving intervention made? No     Check blood pressure:  B/P 109/72 Pulse  68. Doing Well Cecilie KicksLeanna Lawson  [phone]

## 2017-07-19 ENCOUNTER — Ambulatory Visit: Payer: Self-pay | Admitting: Physician Assistant

## 2017-07-26 ENCOUNTER — Encounter: Payer: Self-pay | Admitting: Physician Assistant

## 2018-01-01 ENCOUNTER — Other Ambulatory Visit: Payer: Self-pay

## 2018-01-01 ENCOUNTER — Encounter (HOSPITAL_COMMUNITY): Payer: Self-pay | Admitting: Emergency Medicine

## 2018-01-01 ENCOUNTER — Emergency Department (HOSPITAL_COMMUNITY)
Admission: EM | Admit: 2018-01-01 | Discharge: 2018-01-01 | Disposition: A | Discharge status: Home / Self Care | Payer: Self-pay | Attending: Emergency Medicine | Admitting: Emergency Medicine

## 2018-01-01 DIAGNOSIS — F17210 Nicotine dependence, cigarettes, uncomplicated: Secondary | ICD-10-CM | POA: Insufficient documentation

## 2018-01-01 DIAGNOSIS — R69 Illness, unspecified: Secondary | ICD-10-CM

## 2018-01-01 DIAGNOSIS — J111 Influenza due to unidentified influenza virus with other respiratory manifestations: Secondary | ICD-10-CM | POA: Insufficient documentation

## 2018-01-01 MED ORDER — BENZONATATE 100 MG PO CAPS
200.0000 mg | ORAL_CAPSULE | Freq: Once | ORAL | Status: AC
  Administered 2018-01-01: 200 mg via ORAL
  Filled 2018-01-01: qty 2

## 2018-01-01 MED ORDER — PROMETHAZINE-CODEINE 6.25-10 MG/5ML PO SYRP: 5.0000 mL | ORAL_SOLUTION | ORAL | Status: DC | PRN

## 2018-01-01 MED ORDER — PROMETHAZINE-CODEINE 6.25-10 MG/5ML PO SYRP: 5.0000 mL | ORAL_SOLUTION | ORAL | 0 refills | Status: DC | PRN

## 2018-01-01 MED ORDER — BENZONATATE 100 MG PO CAPS: 200.0000 mg | ORAL_CAPSULE | Freq: Three times a day (TID) | ORAL | 0 refills | Status: DC | PRN

## 2018-01-01 NOTE — Discharge Instructions (Addendum)
Rest,  Drink plenty of fluids.  Take motrin or tylenol for achiness and fever reduction.  You may take the tessalon for (cough) and/or the phenergan with codeine for cough (this will make you drowsy).  T  Get rechecked for increased shortness of breath,  Increased fever or increasing weakness.

## 2018-01-01 NOTE — ED Triage Notes (Signed)
Pt reports fever and cough since Tuesday, has been exposed to the Flu. OTC medication 1 hour ago.

## 2018-01-01 NOTE — ED Provider Notes (Signed)
[facility] EMERGENCY DEPARTMENT Provider Note   CSN: [id number] Arrival date & time: 01/01/18  1514     History   Chief Complaint Chief Complaint  Patient presents with  . Fever    HPI Trevor Sosa is a 37 y.o. male presenting with a 4 day history of flu like symptoms including fever with tmax of 103.7 which has responded to motrin, last dose taken early this am, cough which has been nonproductive, clear nasal discharge, sore throat, body aches and generalized fatigue.  He denies sob, cp, n/v/d , abdominal pain or dysuria.  His wife was diagnosed with influenza B 7 days ago.  He has been able to maintain PO fluid intake, reporting drinking plenty of water and gatorade, reduced appetite for solid foods, but ate prior to arrival today.  He has taken his wife's cough syrup in addition to an otc generic cough and flu formula medicine with some improved sx.  The history is provided by the patient.    History reviewed. No pertinent past medical history.  Patient Active Problem List   Diagnosis Date Noted  . Cigarette nicotine dependence without complication 02/04/2017    Past Surgical History:  Procedure Laterality Date  . CYSTECTOMY     Face, Chest, L knee          Home Medications    Prior to Admission medications   Medication Sig Start Date End Date Taking? Authorizing Provider  benzonatate (TESSALON) 100 MG capsule Take 2 capsules (200 mg total) by mouth 3 (three) times daily as needed. 01/01/18   Burgess Amor, PA-C  promethazine-codeine (PHENERGAN WITH CODEINE) 6.25-10 MG/5ML syrup Take 5 mLs by mouth every 4 (four) hours as needed for cough. 01/01/18   Burgess Amor, PA-C    Family History Family History  Problem Relation Age of Onset  . Diabetes Mother   . Hypertension Mother   . Hypertension Maternal Grandmother   . Diabetes Maternal Grandmother     Social History Social History   Tobacco Use  . Smoking status: Current Every Day Smoker    Packs/day: 0.50   Years: 18.00    Pack years: 9.00    Types: Cigarettes  . Smokeless tobacco: Never Used  Substance Use Topics  . Alcohol use: Yes    Alcohol/week: 4.8 - 6.0 oz    Types: 8 - 10 Cans of beer per week    Comment: drinks 8-10 Bud Lights on the weekends  . Drug use: No     Allergies   Patient has no known allergies.   Review of Systems Review of Systems  Constitutional: Positive for chills and fever.  HENT: Positive for rhinorrhea and sore throat. Negative for congestion, ear pain, sinus pressure, trouble swallowing and voice change.   Eyes: Negative for discharge.  Respiratory: Positive for cough. Negative for chest tightness, shortness of breath, wheezing and stridor.   Cardiovascular: Negative for chest pain.  Gastrointestinal: Negative for abdominal pain, diarrhea, nausea and vomiting.  Genitourinary: Negative.  Negative for decreased urine volume, dysuria and frequency.  Musculoskeletal: Positive for myalgias.  Skin: Negative for rash.  Neurological: Negative for headaches.     Physical Exam Updated Vital Signs BP 127/81 (BP Location: Left Arm)   Pulse 67   Temp 98.5 F (36.9 C) (Oral)   Resp 18   Wt 68 kg (150 lb)   SpO2 100%   BMI 20.92 kg/m   Physical Exam  Constitutional: He is oriented to person, place, and time. He  appears well-developed and well-nourished.  HENT:  Head: Normocephalic and atraumatic.  Right Ear: Tympanic membrane and ear canal normal.  Left Ear: Tympanic membrane and ear canal normal.  Nose: Rhinorrhea present. No mucosal edema.  Mouth/Throat: Uvula is midline, oropharynx is clear and moist and mucous membranes are normal. Mucous membranes are not dry. No oropharyngeal exudate, posterior oropharyngeal edema, posterior oropharyngeal erythema or tonsillar abscesses.  Eyes: Conjunctivae are normal.  Cardiovascular: Normal rate, regular rhythm and normal heart sounds.  Pulmonary/Chest: Effort normal and breath sounds normal. No stridor. No  respiratory distress. He has no decreased breath sounds. He has no wheezes. He has no rhonchi. He has no rales.  Abdominal: Soft. Bowel sounds are normal. There is no tenderness.  Musculoskeletal: Normal range of motion.  Neurological: He is alert and oriented to person, place, and time.  Skin: Skin is warm and dry. No rash noted.  Psychiatric: He has a normal mood and affect.     ED Treatments / Results  Labs (all labs ordered are listed, but only abnormal results are displayed) Labs Reviewed - No data to display  EKG None  Radiology No results found.  Procedures Procedures (including critical care time)  Medications Ordered in ED Medications  benzonatate (TESSALON) capsule 200 mg (has no administration in time range)     Initial Impression / Assessment and Plan / ED Course  I have reviewed the triage vital signs and the nursing notes.  Pertinent labs & imaging results that were available during my care of the patient were reviewed by me and considered in my medical decision making (see chart for details).     Pt with relatively normal exam, no distress, but with flu like sx and positive close exposure, suggesting acute influenza.  His vital signs are stable, lungs clear, VSS,  no signs of dehydration or other findings suggesting need for imaging or labs.  He was prescribed tessalon for cough, if not effective, also prescribed phenergan/codeine.  Rest, fluids, continued tx with antipyretics. Strict return precautions discussed.   Final Clinical Impressions(s) / ED Diagnoses   Final diagnoses:  Influenza-like illness    ED Discharge Orders        Ordered    benzonatate (TESSALON) 100 MG capsule  3 times daily PRN     01/01/18 1620    promethazine-codeine (PHENERGAN WITH CODEINE) 6.25-10 MG/5ML syrup  Every 4 hours PRN     01/01/18 1625       Burgess Amordol, Rubby Barbary, PA-C 01/01/18 1639    Derwood KaplanNanavati, Ankit, MD 01/02/18 1455

## 2018-04-11 ENCOUNTER — Ambulatory Visit: Payer: Self-pay | Admitting: Physician Assistant

## 2019-04-07 ENCOUNTER — Encounter (HOSPITAL_COMMUNITY): Payer: Self-pay | Admitting: Emergency Medicine

## 2019-04-07 ENCOUNTER — Other Ambulatory Visit: Payer: Self-pay

## 2019-04-07 ENCOUNTER — Emergency Department (HOSPITAL_COMMUNITY)
Admission: EM | Admit: 2019-04-07 | Discharge: 2019-04-07 | Disposition: A | Discharge status: Home / Self Care | Payer: Self-pay | Attending: Emergency Medicine | Admitting: Emergency Medicine

## 2019-04-07 ENCOUNTER — Emergency Department (HOSPITAL_COMMUNITY): Payer: Self-pay

## 2019-04-07 DIAGNOSIS — F17210 Nicotine dependence, cigarettes, uncomplicated: Secondary | ICD-10-CM | POA: Insufficient documentation

## 2019-04-07 DIAGNOSIS — Z23 Encounter for immunization: Secondary | ICD-10-CM | POA: Insufficient documentation

## 2019-04-07 DIAGNOSIS — L03116 Cellulitis of left lower limb: Secondary | ICD-10-CM | POA: Insufficient documentation

## 2019-04-07 LAB — CBC WITH DIFFERENTIAL/PLATELET
Abs Immature Granulocytes: 0.02 10*3/uL (ref 0.00–0.07)
Basophils Absolute: 0 10*3/uL (ref 0.0–0.1)
Basophils Relative: 0 %
Eosinophils Absolute: 0.1 10*3/uL (ref 0.0–0.5)
Eosinophils Relative: 1 %
HCT: 41 % (ref 39.0–52.0)
Hemoglobin: 13.9 g/dL (ref 13.0–17.0)
Immature Granulocytes: 0 %
Lymphocytes Relative: 31 %
Lymphs Abs: 2.1 10*3/uL (ref 0.7–4.0)
MCH: 32.3 pg (ref 26.0–34.0)
MCHC: 33.9 g/dL (ref 30.0–36.0)
MCV: 95.1 fL (ref 80.0–100.0)
Monocytes Absolute: 0.7 10*3/uL (ref 0.1–1.0)
Monocytes Relative: 10 %
Neutro Abs: 4 10*3/uL (ref 1.7–7.7)
Neutrophils Relative %: 58 %
Platelets: 208 10*3/uL (ref 150–400)
RBC: 4.31 MIL/uL (ref 4.22–5.81)
RDW: 12.7 % (ref 11.5–15.5)
WBC: 6.9 10*3/uL (ref 4.0–10.5)
nRBC: 0 % (ref 0.0–0.2)

## 2019-04-07 LAB — BASIC METABOLIC PANEL
Anion gap: 7 (ref 5–15)
BUN: 9 mg/dL (ref 6–20)
CO2: 24 mmol/L (ref 22–32)
Calcium: 8.2 mg/dL — ABNORMAL LOW (ref 8.9–10.3)
Chloride: 98 mmol/L (ref 98–111)
Creatinine, Ser: 0.82 mg/dL (ref 0.61–1.24)
GFR calc Af Amer: >60 mL/min (ref >60)
GFR calc non Af Amer: >60 mL/min (ref >60)
Glucose, Bld: 81 mg/dL (ref 70–99)
Potassium: 3.5 mmol/L (ref 3.5–5.1)
Sodium: 129 mmol/L — ABNORMAL LOW (ref 135–145)

## 2019-04-07 MED ORDER — SULFAMETHOXAZOLE-TRIMETHOPRIM 800-160 MG PO TABS
1.0000 | ORAL_TABLET | Freq: Once | ORAL | Status: AC
  Administered 2019-04-07: 1 via ORAL
  Filled 2019-04-07: qty 1

## 2019-04-07 MED ORDER — CEPHALEXIN 500 MG PO CAPS: 500.0000 mg | ORAL_CAPSULE | Freq: Four times a day (QID) | ORAL | 0 refills | Status: DC

## 2019-04-07 MED ORDER — TETANUS-DIPHTH-ACELL PERTUSSIS 5-2.5-18.5 LF-MCG/0.5 IM SUSP
0.5000 mL | Freq: Once | INTRAMUSCULAR | Status: AC
  Administered 2019-04-07: 0.5 mL via INTRAMUSCULAR
  Filled 2019-04-07: qty 0.5

## 2019-04-07 MED ORDER — BACITRACIN-NEOMYCIN-POLYMYXIN 400-5-5000 EX OINT
TOPICAL_OINTMENT | Freq: Once | CUTANEOUS | Status: AC
  Administered 2019-04-07: 2 via TOPICAL
  Filled 2019-04-07: qty 3

## 2019-04-07 MED ORDER — ONDANSETRON HCL 4 MG PO TABS
4.0000 mg | ORAL_TABLET | Freq: Once | ORAL | Status: AC
  Administered 2019-04-07: 4 mg via ORAL
  Filled 2019-04-07: qty 1

## 2019-04-07 MED ORDER — CEPHALEXIN 500 MG PO CAPS
500.0000 mg | ORAL_CAPSULE | Freq: Once | ORAL | Status: AC
Start: 2019-04-07 — End: 2019-04-07
  Administered 2019-04-07: 500 mg via ORAL
  Filled 2019-04-07: qty 1

## 2019-04-07 MED ORDER — SULFAMETHOXAZOLE-TRIMETHOPRIM 800-160 MG PO TABS
1.0000 | ORAL_TABLET | Freq: Two times a day (BID) | ORAL | 0 refills | Status: AC
Start: 2019-04-07 — End: 2019-04-14

## 2019-04-07 NOTE — ED Notes (Signed)
Pt going to restroom  

## 2019-04-07 NOTE — Discharge Instructions (Signed)
Your oxygen level is normal at 99%.  Your blood pressure slightly elevated, but otherwise your vital signs within normal limits.  The x-ray of the left lower leg is negative for any bone involvement.  There is no evidence of gas from major infection.  The complete blood count is within normal limits. Your exam is consistent with cellulitis.  Please apply Neosporin bandage daily.  Please use Keflex 4 times daily and Bactrim 2 times daily.  Please have this rechecked on Tuesday, July 14 by your primary physician or return to this emergency department.

## 2019-04-07 NOTE — ED Triage Notes (Signed)
Pt reports a week ago, a metal dog chain cut his LLE. Two days ago he began having redness around laceration. Yesterday, swelling spread to ankle. Endorses intermittent fevers.

## 2019-04-07 NOTE — ED Notes (Signed)
XR in room 

## 2019-04-07 NOTE — ED Provider Notes (Signed)
[facility] EMERGENCY DEPARTMENT Provider Note   CSN: [id number] Arrival date & time: 04/07/19  [place]     History   Chief Complaint Chief Complaint  Patient presents with  . Leg Swelling    HPI Trevor Sosa is a 38 y.o. male.     Patient is a 38 year old male who presents to the emergency department with a complaint of pain and swelling of the left lower leg.  Patient states that nearly a week ago he sustained an injury to the left lower extremity by a metal dog chain.  The patient states that he cleansed the area daily with peroxide and even applied alcohol and Neosporin.  He says he had some swelling of the area.  He had some drainage of the area.  He says the drainage has mostly stopped, but there is increased redness and swelling of the lower portion of the left anterior leg.  There is some soreness when he puts weight on it.  On yesterday, July 9 the redness spread to the ankle area.  He presents now for evaluation the possible infection.     History reviewed. No pertinent past medical history.  Patient Active Problem List   Diagnosis Date Noted  . Cigarette nicotine dependence without complication 44/09/270    Past Surgical History:  Procedure Laterality Date  . CYSTECTOMY     Face, Chest, L knee          Home Medications    Prior to Admission medications   Medication Sig Start Date End Date Taking? Authorizing Provider  benzonatate (TESSALON) 100 MG capsule Take 2 capsules (200 mg total) by mouth 3 (three) times daily as needed. 01/01/18   Evalee Jefferson, PA-C  promethazine-codeine (PHENERGAN WITH CODEINE) 6.25-10 MG/5ML syrup Take 5 mLs by mouth every 4 (four) hours as needed for cough. 01/01/18   Evalee Jefferson, PA-C    Family History Family History  Problem Relation Age of Onset  . Diabetes Mother   . Hypertension Mother   . Hypertension Maternal Grandmother   . Diabetes Maternal Grandmother     Social History Social History   Tobacco Use  . Smoking  status: Current Every Day Smoker    Packs/day: 0.50    Years: 18.00    Pack years: 9.00    Types: Cigarettes  . Smokeless tobacco: Never Used  Substance Use Topics  . Alcohol use: Yes    Alcohol/week: 8.0 - 10.0 standard drinks    Types: 8 - 10 Cans of beer per week    Comment: drinks 8-10 Bud Lights on the weekends  . Drug use: No     Allergies   Patient has no known allergies.   Review of Systems Review of Systems  Constitutional: Negative for activity change.       All ROS Neg except as noted in HPI  HENT: Negative for nosebleeds.   Eyes: Negative for photophobia and discharge.  Respiratory: Negative for cough, shortness of breath and wheezing.   Cardiovascular: Negative for chest pain and palpitations.  Gastrointestinal: Negative for abdominal pain and blood in stool.  Genitourinary: Negative for dysuria, frequency and hematuria.  Musculoskeletal: Negative for arthralgias, back pain and neck pain.  Skin: Positive for wound.       Left leg wound  Neurological: Negative for dizziness, seizures and speech difficulty.  Psychiatric/Behavioral: Negative for confusion and hallucinations.     Physical Exam Updated Vital Signs BP (!) 135/92 (BP Location: Left Arm)   Pulse 77  Temp 97.9 F (36.6 C) (Oral)   Resp 14   Ht 6' (1.829 m)   Wt 63.5 kg   SpO2 99%   BMI 18.99 kg/m   Physical Exam Vitals signs and nursing note reviewed.  Constitutional:      General: He is not in acute distress.    Appearance: He is well-developed.  HENT:     Head: Normocephalic and atraumatic.     Right Ear: External ear normal.     Left Ear: External ear normal.  Eyes:     General: No scleral icterus.       Right eye: No discharge.        Left eye: No discharge.     Conjunctiva/sclera: Conjunctivae normal.  Neck:     Musculoskeletal: Neck supple.     Trachea: No tracheal deviation.  Cardiovascular:     Rate and Rhythm: Normal rate and regular rhythm.  Pulmonary:     Effort:  Pulmonary effort is normal. No respiratory distress.     Breath sounds: Normal breath sounds. No stridor. No wheezing or rales.  Abdominal:     General: Bowel sounds are normal. There is no distension.     Palpations: Abdomen is soft.     Tenderness: There is no abdominal tenderness. There is no guarding or rebound.  Musculoskeletal:        General: No tenderness.     Comments: There is a 3.2 cm scabbed area of the anterior left tibia.  There is increased redness around the site with extension of the redness into the ankle area lateral greater than medial.  The area is warm, but not hot.  Dorsalis pedis pulses 2+ bilaterally.  Skin:    General: Skin is warm and dry.     Findings: No rash.  Neurological:     Mental Status: He is alert.     Cranial Nerves: No cranial nerve deficit (no facial droop, extraocular movements intact, no slurred speech).     Sensory: No sensory deficit.     Motor: No abnormal muscle tone or seizure activity.     Coordination: Coordination normal.           ED Treatments / Results  Labs (all labs ordered are listed, but only abnormal results are displayed) Labs Reviewed  CBC WITH DIFFERENTIAL/PLATELET  BASIC METABOLIC PANEL    EKG None  Radiology No results found.  Procedures Procedures (including critical care time)  Medications Ordered in ED Medications - No data to display   Initial Impression / Assessment and Plan / ED Course  I have reviewed the triage vital signs and the nursing notes.  Pertinent labs & imaging results that were available during my care of the patient were reviewed by me and considered in my medical decision making (see chart for details).          Final Clinical Impressions(s) / ED Diagnoses MDM  Vital signs reviewed.  Pulse oximetry is 99% on room air.  Within normal limits by my interpretation.  Patient sustained an injury to the left lower anterior tibial area with a dog chain.  Patient has been trying  conservative measures at home with Neosporin, alcohol, and peroxide.  He continues to have increased redness, swelling, and tenderness.  Tetanus updated.  X-ray of the tibial/fibula show soft tissue swelling without evidence of an acute or displaced fracture.  No foreign body appreciated.  No gas appreciated. Complete blood count is well within normal limits.  Patient will  be started on Keflex and Bactrim.  Neosporin dressing applied.  The patient will return on Monday, July 13 or Tuesday, July 14 for recheck by the primary physician or return to this emergency department.  Patient is in agreement with this plan.   Final diagnoses:  Cellulitis of left anterior lower leg    ED Discharge Orders         Ordered    cephALEXin (KEFLEX) 500 MG capsule  4 times daily     04/07/19 2040    sulfamethoxazole-trimethoprim (BACTRIM DS) 800-160 MG tablet  2 times daily     04/07/19 2040           Ivery QualeBryant, Nejla Reasor, PA-C 04/07/19 2052    Bethann BerkshireZammit, Joseph, MD 04/08/19 1121

## 2019-09-26 ENCOUNTER — Encounter: Payer: Self-pay | Admitting: Physician Assistant

## 2019-09-26 ENCOUNTER — Ambulatory Visit: Payer: Self-pay | Attending: Internal Medicine

## 2019-09-26 ENCOUNTER — Ambulatory Visit: Payer: Self-pay | Admitting: Physician Assistant

## 2019-09-26 ENCOUNTER — Other Ambulatory Visit: Payer: Self-pay

## 2019-09-26 VITALS — BP 140/78 | HR 93 | Temp 100.0°F | Wt 135.0 lb

## 2019-09-26 DIAGNOSIS — Z789 Other specified health status: Secondary | ICD-10-CM

## 2019-09-26 DIAGNOSIS — Z20822 Contact with and (suspected) exposure to covid-19: Secondary | ICD-10-CM

## 2019-09-26 DIAGNOSIS — Z20828 Contact with and (suspected) exposure to other viral communicable diseases: Secondary | ICD-10-CM | POA: Insufficient documentation

## 2019-09-26 DIAGNOSIS — Z7689 Persons encountering health services in other specified circumstances: Secondary | ICD-10-CM

## 2019-09-26 DIAGNOSIS — F1090 Alcohol use, unspecified, uncomplicated: Secondary | ICD-10-CM

## 2019-09-26 DIAGNOSIS — R509 Fever, unspecified: Secondary | ICD-10-CM

## 2019-09-26 DIAGNOSIS — F17200 Nicotine dependence, unspecified, uncomplicated: Secondary | ICD-10-CM

## 2019-09-26 NOTE — Patient Instructions (Signed)
    Person Under Monitoring Name: Trevor Sosa  Location: [address] [place] [address]   CORONAVIRUS DISEASE 2019 (COVID-19) Guidance for Persons Under Investigation You are being tested for the virus that causes coronavirus disease 2019 (COVID-19). Public health actions are necessary to ensure protection of your health and the health of others, and to prevent further spread of infection. COVID-19 is caused by a virus that can cause symptoms, such as fever, cough, and shortness of breath. The primary transmission from person to person is by coughing or sneezing. On October 27, 2018, the [facility] announced a [employer] Emergency of International Concern and on October 28, 2018 the U.S. Department of Health and Human Services declared a public health emergency. If the virus that causesCOVID-19 spreads in the community, it could have severe public health consequences.  As a person under investigation for COVID-19, the [facility] advises you to adhere to the following guidance until your test results are reported to you. If your test result is positive, you will receive additional information from your provider and your local health department at that time.   Remain at home until you are cleared by your health provider or public health authorities.   Keep a log of visitors to your home using the form provided. Any visitors to your home must be aware of your isolation status.  If you plan to move to a new address or leave the county, notify the local health department in your county.  Call a doctor or seek care if you have an urgent medical need. Before seeking medical care, call ahead and get instructions from the provider before arriving at the medical office, clinic or hospital. Notify them that you are being tested for the virus that causes COVID-19 so arrangements can be made, as necessary, to prevent  transmission to others in the healthcare setting. Next, notify the local health department in your county.  If a medical emergency arises and you need to call 911, inform the first responders that you are being tested for the virus that causes COVID-19. Next, notify the local health department in your county.  Adhere to all guidance set forth by the [facility] for [facility] of patients that is based on guidance from the Center for Disease Control and Prevention with suspected or confirmed COVID-19. It is provided with this guidance for Persons Under Investigation.  Your health and the health of our community are our top priorities. Public Health officials remain available to provide assistance and counseling to you about COVID-19 and compliance with this guidance.  Provider: ____________________________________________________________ Date: ______/_____/_________  By signing below, you acknowledge that you have read and agree to comply with this Guidance for Persons Under Investigation. ______________________________________________________________ Date: ______/_____/_________  WHO DO I CALL? You can find a list of local health departments here: [email or web address] Health Department: ____________________________________________________________________ Contact Name: ________________________________________________________________________ Telephone: ___________________________________________________________________________  Marice Potter, Maish Vaya, Communicable Disease Branch COVID-19 Guidance for Persons Under Investigation December 03, 2018

## 2019-09-26 NOTE — Progress Notes (Signed)
BP 140/78   Pulse 93   Temp 100 F (37.8 C)   Wt 135 lb (61.2 kg)   SpO2 98%   BMI 18.31 kg/m    Subjective:    Patient ID: Trevor Sosa, male    DOB: 10-16-1980, 38 y.o.   MRN: [id number]  HPI: Trevor Sosa is a 38 y.o. male presenting on 09/26/2019 for New Patient (Initial Visit) (previous pt at [facility] pt last went 05/2018. pt reports no complaints and says he is here to establish care)   HPI   Pt had a negative covid 19 screening questionnaire but is noted to have temperature of 100 even after having his hat removed > 10 minutes.    Pt here today to establish care.  [facility] but he was Only seen two times.  He was Last seen here 04/14/17.  He was seen at [facility] since that time.  Pt says [facility] told him his "liver count was up".   Pt says he still drinks 8-12 beer every other day.    He is not working currently.  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   He doesn't think his etoh interefers with him getting a job.  He doesn't think he is alcoholic.  He does admit that it is very expensive.    Pt says he does not have cough or congestion and doesn't know that he has been around anyone with covid 19.      Relevant past medical, surgical, family and social history reviewed and updated as indicated. Interim medical history since our last visit reviewed. Allergies and medications reviewed and updated.  Review of Systems  Per HPI unless specifically indicated above     Objective:    BP 140/78   Pulse 93   Temp 100 F (37.8 C)   Wt 135 lb (61.2 kg)   SpO2 98%   BMI 18.31 kg/m   Wt Readings from Last 3 Encounters:  09/26/19 135 lb (61.2 kg)  04/07/19 140 lb (63.5 kg)  01/01/18 150 lb (68 kg)    Physical Exam Vitals reviewed.  Constitutional:      General: He is not in acute distress.    Appearance: He is ill-appearing. He is not toxic-appearing.     Comments: Pt does not appear acutely ill but looks chronically  ill  HENT:     Head: Normocephalic and atraumatic.  Cardiovascular:     Pulses: Normal pulses.     Heart sounds: Normal heart sounds.  Pulmonary:     Effort: Pulmonary effort is normal. No respiratory distress.  Musculoskeletal:     Cervical back: Neck supple.     Right lower leg: No edema.     Left lower leg: No edema.  Neurological:     Mental Status: He is alert and oriented to person, place, and time.  Psychiatric:        Attention and Perception: Attention normal.        Speech: Speech normal.        Behavior: Behavior is cooperative.           Assessment & Plan:    Encounter Diagnoses  Name Primary?  . Encounter to establish care Yes  . Fever, unspecified fever cause   . Suspected COVID-19 virus infection   . Tobacco use disorder   . Heavy alcohol consumption       -Pt is sent for covid  testing.  He is counseled on quarantining and is given reading information on this.   -Will check labs after covid testing completed -Discussed alcohol consumption and need to decrease or stop altogether -Will follow up with pt with virtual appointemnt in 1 month.   Pt will contact office sooner prn

## 2019-09-27 LAB — NOVEL CORONAVIRUS, NAA: SARS-CoV-2, NAA: NOT DETECTED

## 2019-10-02 ENCOUNTER — Telehealth: Payer: Self-pay | Admitting: Student

## 2019-10-02 NOTE — Telephone Encounter (Signed)
LPN called pt to notify him of negative COVID results, but pt was not available.   LPN spoke with pt's wife and asked her to have pt call the office back regarding his results. Pt's wife agrees to have pt call back.

## 2019-10-02 NOTE — Telephone Encounter (Signed)
Pt called back and was notified that COVID test was negative. Pt verbalized understanding.

## 2019-10-15 ENCOUNTER — Other Ambulatory Visit: Payer: Self-pay | Admitting: Physician Assistant

## 2019-10-15 DIAGNOSIS — Z13220 Encounter for screening for lipoid disorders: Secondary | ICD-10-CM

## 2019-10-15 DIAGNOSIS — R636 Underweight: Secondary | ICD-10-CM

## 2019-10-15 DIAGNOSIS — F1090 Alcohol use, unspecified, uncomplicated: Secondary | ICD-10-CM

## 2019-10-15 DIAGNOSIS — Z789 Other specified health status: Secondary | ICD-10-CM

## 2019-10-24 ENCOUNTER — Ambulatory Visit: Payer: Self-pay | Admitting: Physician Assistant

## 2019-11-01 ENCOUNTER — Other Ambulatory Visit: Payer: Self-pay

## 2019-11-01 ENCOUNTER — Other Ambulatory Visit (HOSPITAL_COMMUNITY)
Admission: RE | Admit: 2019-11-01 | Discharge: 2019-11-01 | Disposition: A | Discharge status: Home / Self Care | Payer: Self-pay | Source: Ambulatory Visit | Attending: Physician Assistant | Admitting: Physician Assistant

## 2019-11-01 DIAGNOSIS — F1090 Alcohol use, unspecified, uncomplicated: Secondary | ICD-10-CM

## 2019-11-01 DIAGNOSIS — Z789 Other specified health status: Secondary | ICD-10-CM | POA: Insufficient documentation

## 2019-11-01 DIAGNOSIS — Z13220 Encounter for screening for lipoid disorders: Secondary | ICD-10-CM | POA: Insufficient documentation

## 2019-11-01 DIAGNOSIS — R636 Underweight: Secondary | ICD-10-CM | POA: Insufficient documentation

## 2019-11-01 LAB — CBC
HCT: 43.2 % (ref 39.0–52.0)
Hemoglobin: 14.8 g/dL (ref 13.0–17.0)
MCH: 32.5 pg (ref 26.0–34.0)
MCHC: 34.3 g/dL (ref 30.0–36.0)
MCV: 94.7 fL (ref 80.0–100.0)
Platelets: 267 10*3/uL (ref 150–400)
RBC: 4.56 MIL/uL (ref 4.22–5.81)
RDW: 13.3 % (ref 11.5–15.5)
WBC: 5.7 10*3/uL (ref 4.0–10.5)
nRBC: 0 % (ref 0.0–0.2)

## 2019-11-01 LAB — COMPREHENSIVE METABOLIC PANEL
ALT: 39 U/L (ref 0–44)
AST: 35 U/L (ref 15–41)
Albumin: 4.7 g/dL (ref 3.5–5.0)
Alkaline Phosphatase: 69 U/L (ref 38–126)
Anion gap: 8 (ref 5–15)
BUN: 13 mg/dL (ref 6–20)
CO2: 30 mmol/L (ref 22–32)
Calcium: 9.4 mg/dL (ref 8.9–10.3)
Chloride: 100 mmol/L (ref 98–111)
Creatinine, Ser: 0.79 mg/dL (ref 0.61–1.24)
GFR calc Af Amer: >60 mL/min (ref >60)
GFR calc non Af Amer: >60 mL/min (ref >60)
Glucose, Bld: 101 mg/dL — ABNORMAL HIGH (ref 70–99)
Potassium: 4.4 mmol/L (ref 3.5–5.1)
Sodium: 138 mmol/L (ref 135–145)
Total Bilirubin: 0.9 mg/dL (ref 0.3–1.2)
Total Protein: 7.9 g/dL (ref 6.5–8.1)

## 2019-11-01 LAB — LIPID PANEL
Cholesterol: 226 mg/dL — ABNORMAL HIGH (ref 0–200)
HDL: 99 mg/dL (ref >40)
LDL Cholesterol: 119 mg/dL — ABNORMAL HIGH (ref 0–99)
Total CHOL/HDL Ratio: 2.3 RATIO
Triglycerides: 41 mg/dL (ref <150)
VLDL: 8 mg/dL (ref 0–40)

## 2019-11-01 LAB — TSH: TSH: 1.575 u[IU]/mL (ref 0.350–4.500)

## 2019-11-02 ENCOUNTER — Encounter: Payer: Self-pay | Admitting: Physician Assistant

## 2019-11-02 ENCOUNTER — Ambulatory Visit: Payer: Self-pay | Admitting: Physician Assistant

## 2019-11-02 DIAGNOSIS — F1090 Alcohol use, unspecified, uncomplicated: Secondary | ICD-10-CM

## 2019-11-02 DIAGNOSIS — E785 Hyperlipidemia, unspecified: Secondary | ICD-10-CM

## 2019-11-02 DIAGNOSIS — F17200 Nicotine dependence, unspecified, uncomplicated: Secondary | ICD-10-CM

## 2019-11-02 DIAGNOSIS — Z789 Other specified health status: Secondary | ICD-10-CM

## 2019-11-02 NOTE — Progress Notes (Signed)
There were no vitals taken for this visit.   Subjective:    Patient ID: Trevor Sosa, male    DOB: 1981-01-09, 39 y.o.   MRN: [id number]  HPI: Trevor Sosa is a 39 y.o. male presenting on 11/02/2019 for No chief complaint on file.   HPI  This is a telemedicine appointment due to coronavirus pandemic.   It is via telephone as pt does not have a video enabled device.  I connected with  Trevor Sosa on 11/02/19 by a video enabled telemedicine application and verified that I am speaking with the correct person using two identifiers.   I discussed the limitations of evaluation and management by telemedicine. The patient expressed understanding and agreed to proceed.  Pt is at home.  Provider is at office    Pt is 38yoM with very heavy Etoh intake who presented to office last month to establish care.  He says he feels well today.  He says he has thought about cutting back on etoh as discussed at appointment last month.   He has no complaints today.    Relevant past medical, surgical, family and social history reviewed and updated as indicated. Interim medical history since our last visit reviewed. Allergies and medications reviewed and updated.  No current outpatient medications on file.    Review of Systems  Per HPI unless specifically indicated above     Objective:    There were no vitals taken for this visit.  Wt Readings from Last 3 Encounters:  09/26/19 135 lb (61.2 kg)  04/07/19 140 lb (63.5 kg)  01/01/18 150 lb (68 kg)    Physical Exam Pulmonary:     Effort: No respiratory distress.  Neurological:     Mental Status: He is alert and oriented to person, place, and time.  Psychiatric:        Attention and Perception: Attention normal.        Speech: Speech normal.        Behavior: Behavior is cooperative.     Results for orders placed or performed during the hospital encounter of 11/01/19  Comprehensive metabolic panel  Result Value Ref Range   Sodium 138  135 - 145 mmol/L   Potassium 4.4 3.5 - 5.1 mmol/L   Chloride 100 98 - 111 mmol/L   CO2 30 22 - 32 mmol/L   Glucose, Bld 101 (H) 70 - 99 mg/dL   BUN 13 6 - 20 mg/dL   Creatinine, Ser 5.46 0.61 - 1.24 mg/dL   Calcium 9.4 8.9 - 56.8 mg/dL   Total Protein 7.9 6.5 - 8.1 g/dL   Albumin 4.7 3.5 - 5.0 g/dL   AST 35 15 - 41 U/L   ALT 39 0 - 44 U/L   Alkaline Phosphatase 69 38 - 126 U/L   Total Bilirubin 0.9 0.3 - 1.2 mg/dL   GFR calc non Af Amer >60 >60 mL/min   GFR calc Af Amer >60 >60 mL/min   Anion gap 8 5 - 15  CBC  Result Value Ref Range   WBC 5.7 4.0 - 10.5 K/uL   RBC 4.56 4.22 - 5.81 MIL/uL   Hemoglobin 14.8 13.0 - 17.0 g/dL   HCT 12.7 51.7 - 00.1 %   MCV 94.7 80.0 - 100.0 fL   MCH 32.5 26.0 - 34.0 pg   MCHC 34.3 30.0 - 36.0 g/dL   RDW 74.9 44.9 - 67.5 %   Platelets 267 150 - 400 K/uL   nRBC  0.0 0.0 - 0.2 %  TSH  Result Value Ref Range   TSH 1.575 0.350 - 4.500 uIU/mL  Lipid panel  Result Value Ref Range   Cholesterol 226 (H) 0 - 200 mg/dL   Triglycerides 41 <150 mg/dL   HDL 99 >40 mg/dL   Total CHOL/HDL Ratio 2.3 RATIO   VLDL 8 0 - 40 mg/dL   LDL Cholesterol 119 (H) 0 - 99 mg/dL      Assessment & Plan:    Encounter Diagnoses  Name Primary?  . Heavy alcohol consumption Yes  . Tobacco use disorder   . Hyperlipidemia, unspecified hyperlipidemia type       -Reviewed labs with pt -Counseled on lowfat diet and making better food choices. -counsled pt again on reducing etoh -pt to follow up 1 year.  He is to contact office sooner prn

## 2020-08-01 IMAGING — CR LEFT TIBIA AND FIBULA - 2 VIEW
4 series · 8 of 8 positions shown · non-contrast
Comparison: None.

CLINICAL DATA: Injury to left lower leg with increasing redness and
swelling.

EXAM:
LEFT TIBIA AND FIBULA - 2 VIEW

[Series 1: ap · 0.17mm/px · 2 of 2 slices shown (1 of 2)]
[im 1/2]
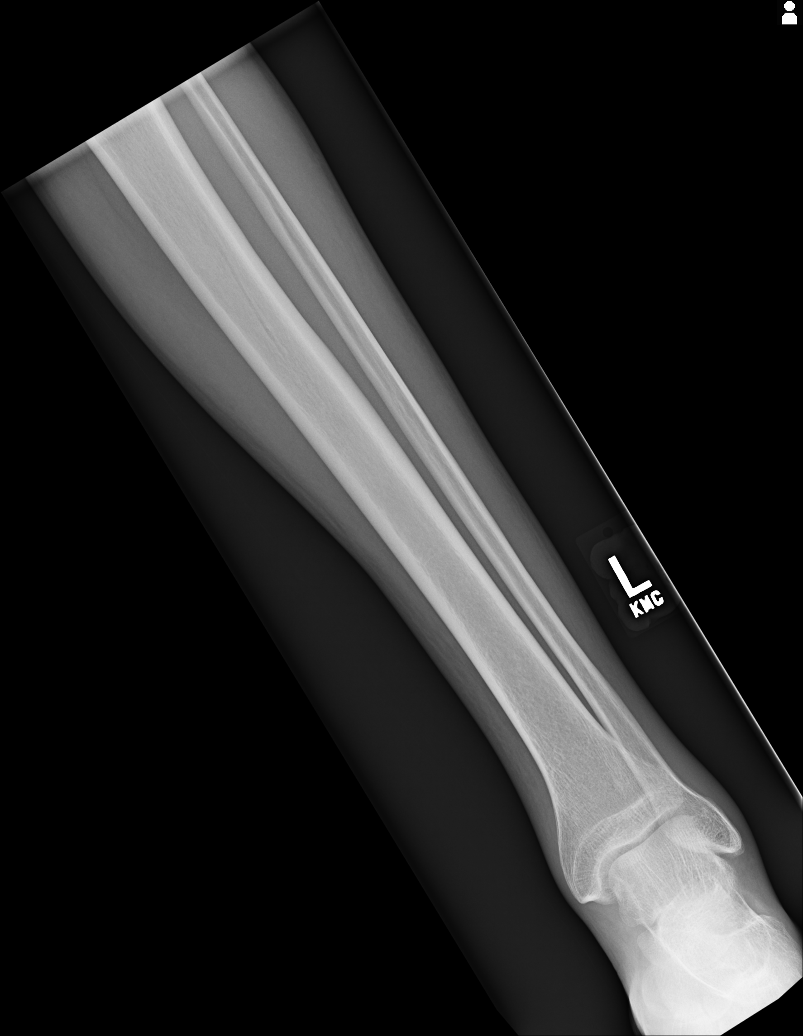
[im 2/2]
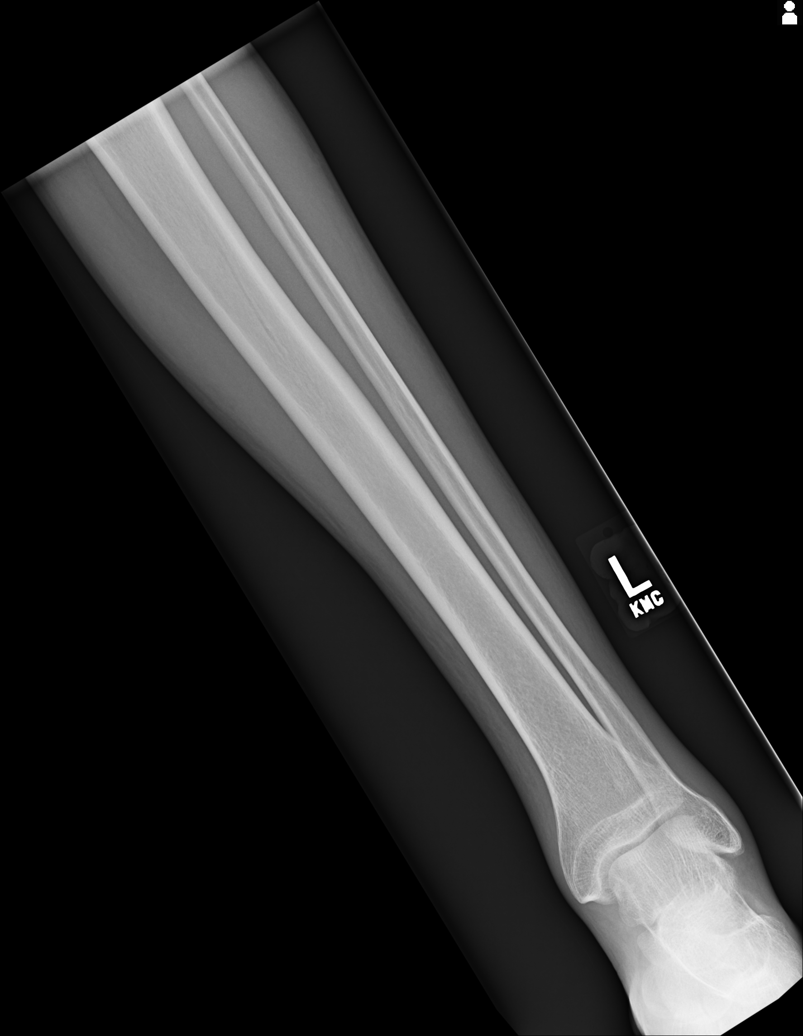

[Series 2: lat · 0.17mm/px · 2 of 2 slices shown (1 of 2)]
[im 1/2]
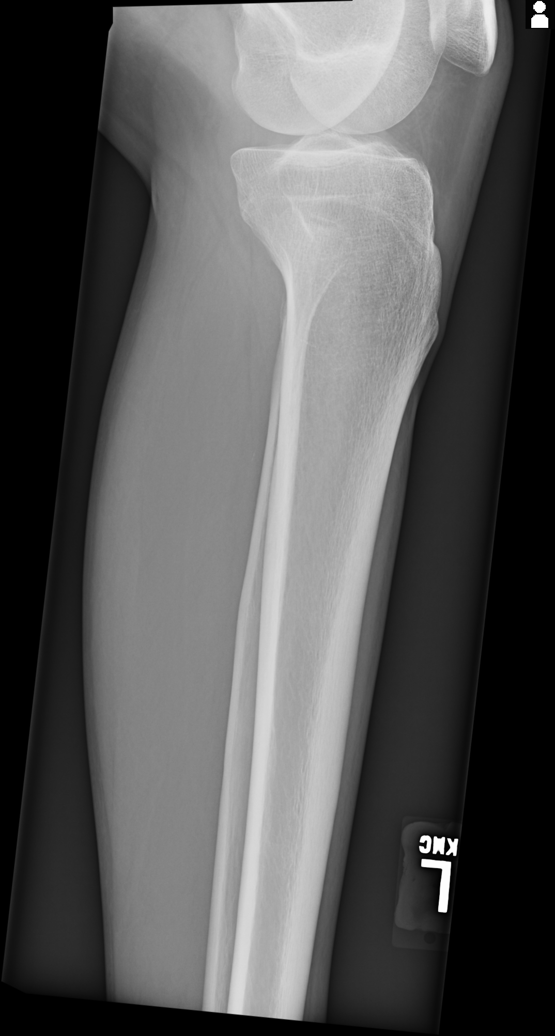
[im 2/2]
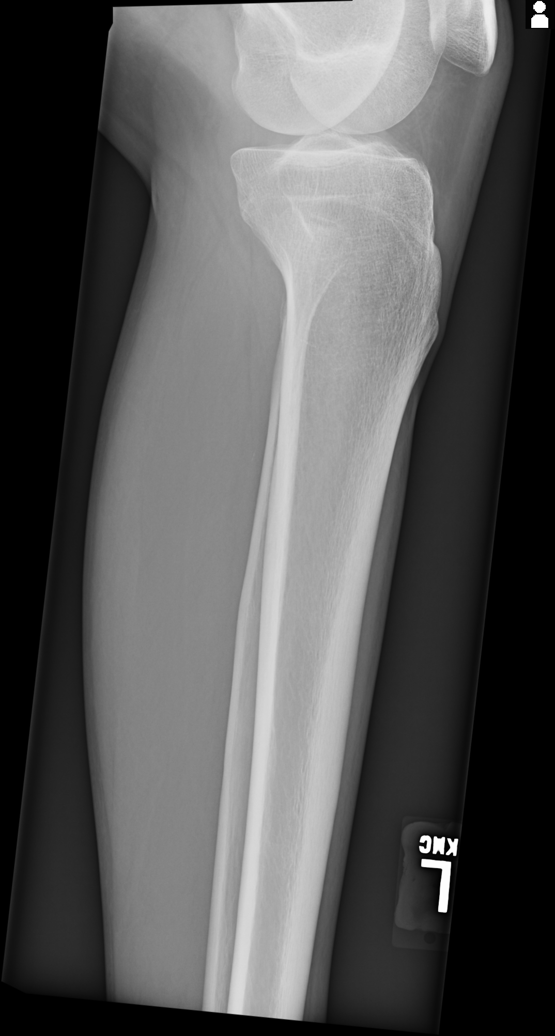

[Series 3: lat · 0.17mm/px · 2 of 2 slices shown (2 of 2)]
[im 1/2]
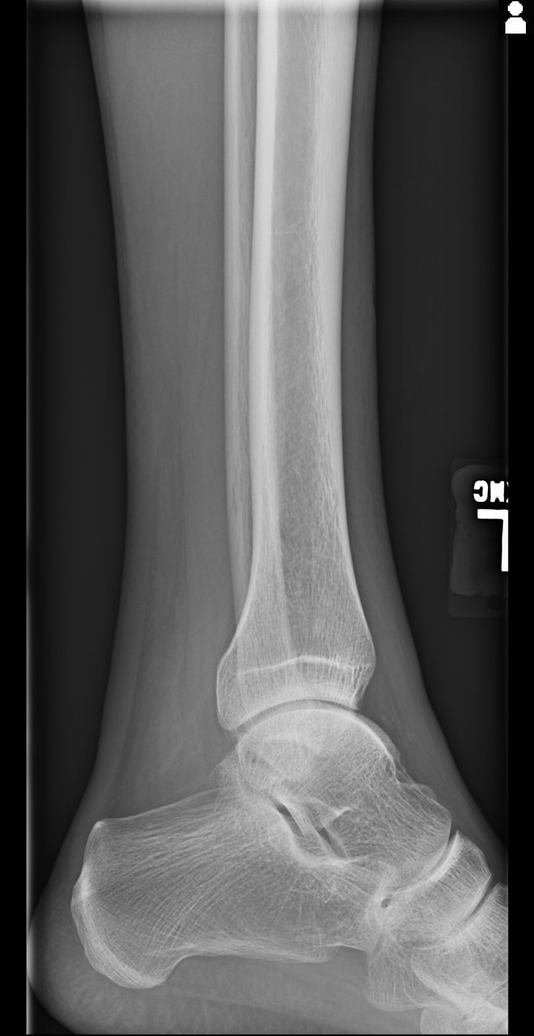
[im 2/2]
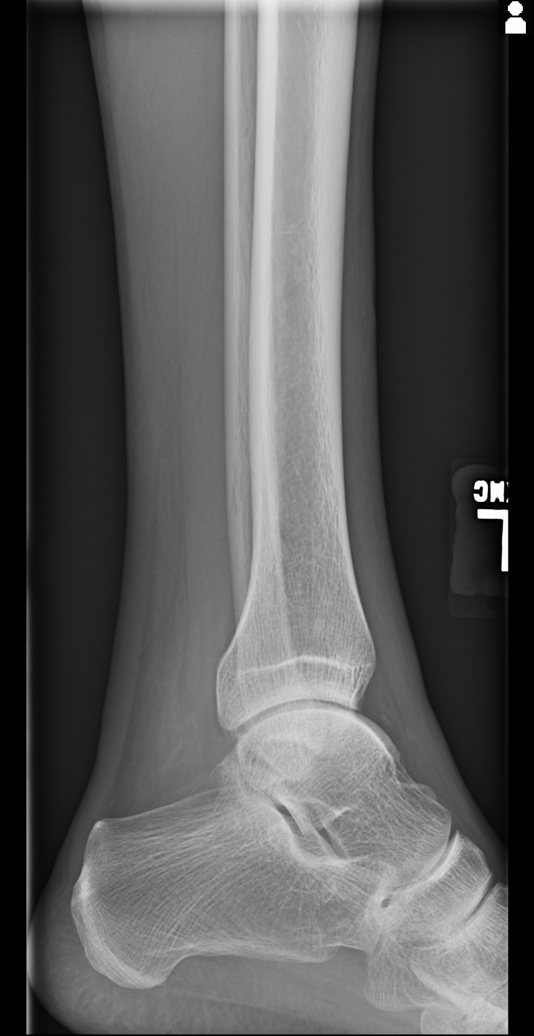

[Series 4: ap · 0.17mm/px · 2 of 2 slices shown (2 of 2)]
[im 1/2]
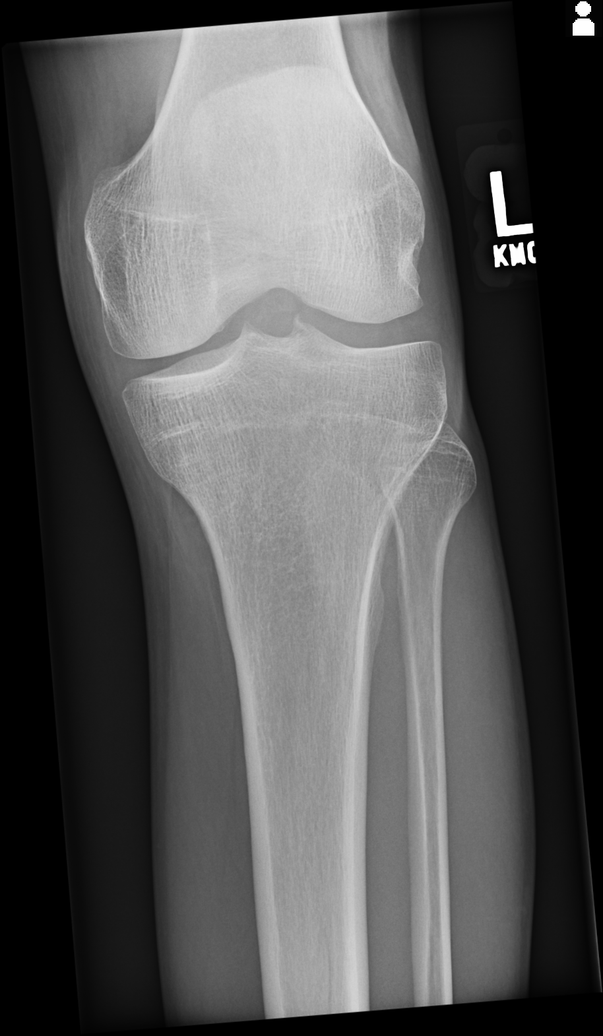
[im 2/2]
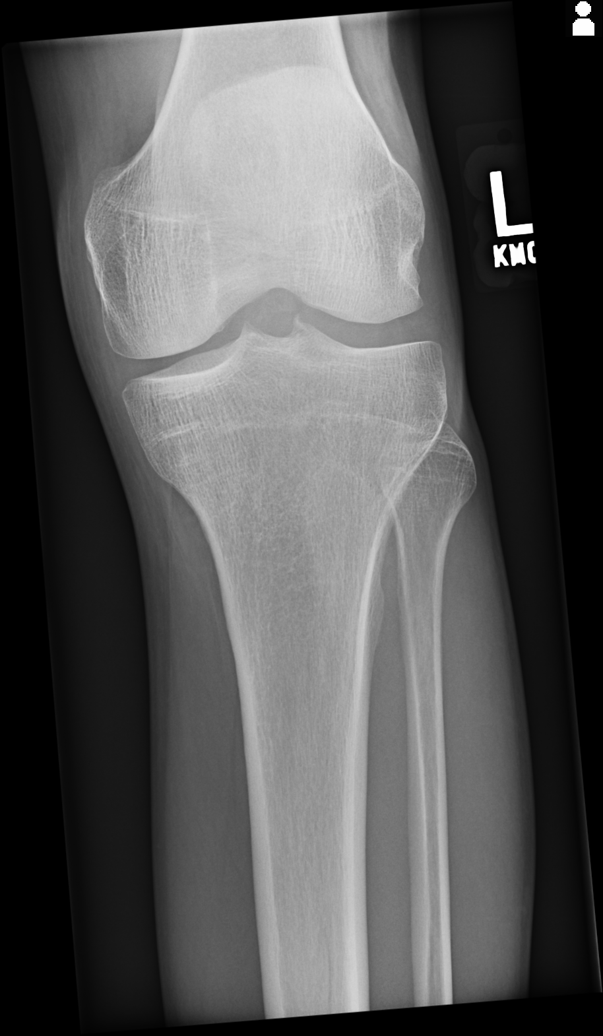

[8 of 8 positions shown; findings below may reference images not displayed]

FINDINGS: There is soft tissue swelling about the ankle and left lower
extremity without evidence of an acute displaced fracture. There is
no radiopaque foreign body.
IMPRESSION: Soft tissue swelling without evidence of an acute displaced
fracture.

## 2020-11-04 ENCOUNTER — Ambulatory Visit: Payer: Self-pay | Admitting: Physician Assistant

## 2021-01-08 ENCOUNTER — Encounter: Payer: Self-pay | Admitting: Physician Assistant

## 2021-01-08 ENCOUNTER — Other Ambulatory Visit: Payer: Self-pay

## 2021-01-08 ENCOUNTER — Ambulatory Visit: Payer: Self-pay | Admitting: Physician Assistant

## 2021-01-08 VITALS — BP 148/80 | HR 80 | Temp 97.3°F | Wt 149.0 lb

## 2021-01-08 DIAGNOSIS — R7989 Other specified abnormal findings of blood chemistry: Secondary | ICD-10-CM

## 2021-01-08 DIAGNOSIS — Z789 Other specified health status: Secondary | ICD-10-CM

## 2021-01-08 DIAGNOSIS — F1090 Alcohol use, unspecified, uncomplicated: Secondary | ICD-10-CM

## 2021-01-08 DIAGNOSIS — F17200 Nicotine dependence, unspecified, uncomplicated: Secondary | ICD-10-CM

## 2021-01-08 DIAGNOSIS — I10 Essential (primary) hypertension: Secondary | ICD-10-CM

## 2021-01-08 MED ORDER — AMLODIPINE BESYLATE 5 MG PO TABS: 5.0000 mg | ORAL_TABLET | Freq: Every day | ORAL | 1 refills | Status: DC

## 2021-01-08 NOTE — Progress Notes (Signed)
   BP (!) 148/80   Pulse 80   Temp (!) 97.3 F (36.3 C)   Wt 149 lb (67.6 kg)   SpO2 99%   BMI 20.21 kg/m    Subjective:    Patient ID: Trevor Sosa, male    DOB: [birth year], 40 y.o.   MRN: [id number]  HPI: Trevor Sosa is a 40 y.o. male presenting on 01/08/2021 for Annual Exam   HPI  Pt had a negative covid 19 screening questionnaire.  Chief Complaint  Patient presents with  . Annual Exam     He was started on amlodipine when in ER last month.  He is still drinking a lot.   Drinks every day 8-10 beer/day.   He considers himself an alcoolic and has thought about getting help.  He went to AA meeting about 8 or 9 years ago.   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  He smokes about 1/2 ppd.  Feels okay.  Not having any CP or sob.   Relevant past medical, surgical, family and social history reviewed and updated as indicated. Interim medical history since our last visit reviewed. Allergies and medications reviewed and updated.   Current Outpatient Medications:  .  amLODipine (NORVASC) 2.5 MG tablet, Take 2.5 mg by mouth daily., Disp: , Rfl:      Review of Systems  Per HPI unless specifically indicated above     Objective:    BP (!) 148/80   Pulse 80   Temp (!) 97.3 F (36.3 C)   Wt 149 lb (67.6 kg)   SpO2 99%   BMI 20.21 kg/m   Wt Readings from Last 3 Encounters:  01/08/21 149 lb (67.6 kg)  09/26/19 135 lb (61.2 kg)  04/07/19 140 lb (63.5 kg)    Physical Exam Vitals reviewed.  Constitutional:      General: He is not in acute distress.    Appearance: He is well-developed. He is not toxic-appearing.  HENT:     Head: Normocephalic and atraumatic.  Cardiovascular:     Rate and Rhythm: Normal rate and regular rhythm.  Pulmonary:     Effort: Pulmonary effort is normal.     Breath sounds: Normal breath sounds. No wheezing.  Abdominal:     General: Bowel sounds are normal.     Palpations: Abdomen is soft.     Tenderness: There  is no abdominal tenderness.  Musculoskeletal:     Cervical back: Neck supple.     Right lower leg: No edema.     Left lower leg: No edema.  Lymphadenopathy:     Cervical: No cervical adenopathy.  Skin:    General: Skin is warm and dry.  Neurological:     Mental Status: He is alert and oriented to person, place, and time.  Psychiatric:        Behavior: Behavior normal.              Assessment & Plan:   Encounter Diagnoses  Name Primary?  . Primary hypertension Yes  . Elevated LFTs   . Heavy alcohol consumption   . Tobacco use disorder     -Increase to amlodipine 5mg  -Check LFTs -pt is put on Dental list -pt is urged to get help with his alcoholism. He is given list of local AA meetings and he says he will go -pt to F/u 4-6 wk

## 2021-01-09 DIAGNOSIS — Z789 Other specified health status: Secondary | ICD-10-CM | POA: Insufficient documentation

## 2021-01-09 DIAGNOSIS — F17200 Nicotine dependence, unspecified, uncomplicated: Secondary | ICD-10-CM | POA: Insufficient documentation

## 2021-01-09 DIAGNOSIS — I10 Essential (primary) hypertension: Secondary | ICD-10-CM | POA: Insufficient documentation

## 2021-01-09 DIAGNOSIS — F1090 Alcohol use, unspecified, uncomplicated: Secondary | ICD-10-CM | POA: Insufficient documentation

## 2021-02-05 ENCOUNTER — Ambulatory Visit: Payer: Self-pay | Admitting: Physician Assistant

## 2021-02-12 ENCOUNTER — Ambulatory Visit: Payer: Self-pay | Admitting: Physician Assistant

## 2021-02-18 ENCOUNTER — Ambulatory Visit: Payer: Self-pay | Admitting: Physician Assistant

## 2021-03-13 ENCOUNTER — Encounter: Payer: Self-pay | Admitting: Physician Assistant

## 2021-04-24 ENCOUNTER — Other Ambulatory Visit: Payer: Self-pay | Admitting: Physician Assistant

## 2021-04-24 MED ORDER — AMLODIPINE BESYLATE 5 MG PO TABS: 5.0000 mg | ORAL_TABLET | Freq: Every day | ORAL | 0 refills | Status: DC

## 2021-05-08 ENCOUNTER — Ambulatory Visit: Payer: Self-pay | Admitting: Physician Assistant

## 2021-05-15 ENCOUNTER — Ambulatory Visit: Payer: Self-pay | Admitting: Physician Assistant

## 2021-05-27 ENCOUNTER — Ambulatory Visit: Payer: Self-pay | Admitting: Physician Assistant

## 2021-06-13 ENCOUNTER — Other Ambulatory Visit: Payer: Self-pay | Admitting: Physician Assistant

## 2021-06-15 ENCOUNTER — Other Ambulatory Visit: Payer: Self-pay

## 2021-06-15 ENCOUNTER — Emergency Department (HOSPITAL_COMMUNITY)
Admission: EM | Admit: 2021-06-15 | Discharge: 2021-06-15 | Disposition: A | Discharge status: Home / Self Care | Payer: Self-pay | Attending: Emergency Medicine | Admitting: Emergency Medicine

## 2021-06-15 ENCOUNTER — Encounter (HOSPITAL_COMMUNITY): Payer: Self-pay | Admitting: *Deleted

## 2021-06-15 DIAGNOSIS — W57XXXA Bitten or stung by nonvenomous insect and other nonvenomous arthropods, initial encounter: Secondary | ICD-10-CM | POA: Insufficient documentation

## 2021-06-15 DIAGNOSIS — U071 COVID-19: Secondary | ICD-10-CM | POA: Insufficient documentation

## 2021-06-15 DIAGNOSIS — R509 Fever, unspecified: Secondary | ICD-10-CM | POA: Insufficient documentation

## 2021-06-15 DIAGNOSIS — F17210 Nicotine dependence, cigarettes, uncomplicated: Secondary | ICD-10-CM | POA: Insufficient documentation

## 2021-06-15 MED ORDER — DOXYCYCLINE HYCLATE 100 MG PO TABS: 100.0000 mg | ORAL_TABLET | Freq: Two times a day (BID) | ORAL | 0 refills | Status: DC

## 2021-06-15 NOTE — ED Triage Notes (Signed)
Pt fever and chills x 2 days.  +HA Friday. Cough at times. Took tylenol early this morning.  Highest 100.3 at home per pt. Denies any known Covid exposure.

## 2021-06-15 NOTE — ED Provider Notes (Signed)
[facility] EMERGENCY DEPARTMENT Provider Note   CSN: [id number] Arrival date & time: 06/15/21  1133     History Chief Complaint  Patient presents with   Fever    Trevor Sosa is a 40 y.o. male.   Fever  Patient presents to the ER for evaluation of fevers chills.  Patient states he started having symptoms a couple days ago.  He has had some slight cough.  He has had some mild headache.  He denies any sore throat.  No shortness of breath.  No known COVID exposure.  Denies any abdominal pain.  No urinary frequency or burning.  Patient did have a tick bite a couple weeks ago but has not noticed any rashes.  Temperatures have been elevated either up to 100.2 or 102.  He is not sure which.  History reviewed. No pertinent past medical history.  Patient Active Problem List   Diagnosis Date Noted   Primary hypertension 01/09/2021   Heavy alcohol consumption 01/09/2021   Tobacco use disorder 01/09/2021   Cigarette nicotine dependence without complication 02/04/2017    Past Surgical History:  Procedure Laterality Date   CYSTECTOMY     Face, Chest, L knee         Family History  Problem Relation Age of Onset   Diabetes Mother    Hypertension Mother    Hypertension Maternal Grandmother    Diabetes Maternal Grandmother     Social History   Tobacco Use   Smoking status: Every Day    Packs/day: 0.50    Years: 21.00    Pack years: 10.50    Types: Cigarettes   Smokeless tobacco: Never  Vaping Use   Vaping Use: Never used  Substance Use Topics   Alcohol use: Yes    Alcohol/week: 48.0 standard drinks    Types: 48 Cans of beer per week    Comment: occ. use   Drug use: No    Home Medications Prior to Admission medications   Medication Sig Start Date End Date Taking? Authorizing Provider  doxycycline (VIBRA-TABS) 100 MG tablet Take 1 tablet (100 mg total) by mouth 2 (two) times daily. 06/15/21  Yes Linwood Dibbles, MD  amLODipine (NORVASC) 5 MG tablet Take 1 tablet (5 mg  total) by mouth daily. 04/24/21   Jacquelin Hawking, PA-C    Allergies    Patient has no known allergies.  Review of Systems   Review of Systems  Constitutional:  Positive for fever.  All other systems reviewed and are negative.  Physical Exam Updated Vital Signs BP 133/82 (BP Location: Right Arm)   Pulse 79   Temp 99 F (37.2 C) (Oral)   Resp 16   Ht 1.829 m (6')   Wt 67 kg   SpO2 97%   BMI 20.05 kg/m   Physical Exam Vitals and nursing note reviewed.  Constitutional:      General: He is not in acute distress.    Appearance: He is well-developed.  HENT:     Head: Normocephalic and atraumatic.     Right Ear: External ear normal.     Left Ear: External ear normal.     Nose: No rhinorrhea.     Mouth/Throat:     Pharynx: No oropharyngeal exudate or posterior oropharyngeal erythema.  Eyes:     General: No scleral icterus.       Right eye: No discharge.        Left eye: No discharge.     Conjunctiva/sclera: Conjunctivae normal.  Neck:     Trachea: No tracheal deviation.  Cardiovascular:     Rate and Rhythm: Normal rate and regular rhythm.  Pulmonary:     Effort: Pulmonary effort is normal. No respiratory distress.     Breath sounds: Normal breath sounds. No stridor. No wheezing or rales.  Abdominal:     General: Bowel sounds are normal. There is no distension.     Palpations: Abdomen is soft.     Tenderness: There is no abdominal tenderness. There is no guarding or rebound.  Musculoskeletal:        General: No tenderness or deformity.     Cervical back: Neck supple.  Skin:    General: Skin is warm and dry.     Findings: No rash.  Neurological:     General: No focal deficit present.     Mental Status: He is alert.     Cranial Nerves: No cranial nerve deficit (no facial droop, extraocular movements intact, no slurred speech).     Sensory: No sensory deficit.     Motor: No abnormal muscle tone or seizure activity.     Coordination: Coordination normal.   Psychiatric:        Mood and Affect: Mood normal.    ED Results / Procedures / Treatments   Labs (all labs ordered are listed, but only abnormal results are displayed) Labs Reviewed  SARS CORONAVIRUS 2 (TAT 6-24 HRS)    EKG None  Radiology No results found.  Procedures Procedures   Medications Ordered in ED Medications - No data to display  ED Course  I have reviewed the triage vital signs and the nursing notes.  Pertinent labs & imaging results that were available during my care of the patient were reviewed by me and considered in my medical decision making (see chart for details).    MDM Rules/Calculators/A&P                          Patient presents with complaints of fever at home.  Exam is otherwise reassuring.  Lungs are clear.  I doubt pneumonia.  He does not have any signs of infectious rash.  He does not have any urinary symptoms to suggest UTI.  Possible viral illness.  We will send off COVID test.  Patient however also had a recent tick bite.  Will cover empirically for tickborne illness.  Final Clinical Impression(s) / ED Diagnoses Final diagnoses:  Tick bite, unspecified site, initial encounter  Fever, unspecified fever cause    Rx / DC Orders ED Discharge Orders          Ordered    doxycycline (VIBRA-TABS) 100 MG tablet  2 times daily        06/15/21 1217             Linwood Dibbles, MD 06/15/21 1217

## 2021-06-15 NOTE — Discharge Instructions (Signed)
Take the antibiotics as prescribed.  Follow-up with a primary care doctor to be rechecked.  Your COVID test will be available in your MyChart within the next 24 hours

## 2021-06-16 LAB — SARS CORONAVIRUS 2 (TAT 6-24 HRS): SARS Coronavirus 2: POSITIVE — AB

## 2021-11-25 ENCOUNTER — Emergency Department (HOSPITAL_COMMUNITY)
Admission: EM | Admit: 2021-11-25 | Discharge: 2021-11-25 | Disposition: A | Discharge status: Home / Self Care | Payer: Self-pay | Attending: Emergency Medicine | Admitting: Emergency Medicine

## 2021-11-25 ENCOUNTER — Other Ambulatory Visit: Payer: Self-pay

## 2021-11-25 ENCOUNTER — Encounter (HOSPITAL_COMMUNITY): Payer: Self-pay | Admitting: Emergency Medicine

## 2021-11-25 DIAGNOSIS — K0889 Other specified disorders of teeth and supporting structures: Secondary | ICD-10-CM | POA: Insufficient documentation

## 2021-11-25 HISTORY — DX: Allergy to other foods: Z91.018

## 2021-11-25 MED ORDER — PENICILLIN V POTASSIUM 500 MG PO TABS
500.0000 mg | ORAL_TABLET | Freq: Four times a day (QID) | ORAL | 0 refills | Status: AC
Start: 2021-11-25 — End: 2021-12-02

## 2021-11-25 NOTE — ED Triage Notes (Signed)
Pt c/o abscess tooth to right upper since yesterday. Denies any pus. Nad. No obvious swelling noted to face.

## 2021-11-25 NOTE — ED Provider Notes (Signed)
[facility] EMERGENCY DEPARTMENT Provider Note   CSN: [id number] Arrival date & time: 11/25/21  1151     History  Chief Complaint  Patient presents with   Dental Pain    Trevor Sosa is a 41 y.o. male.  HPI  Patient without significant medical history presents with complaints of a dental pain.  Patient states that he started to have dental pain in his right upper molar started yesterday, came on suddenly, constant pain, does not radiate, denies any trauma to the area, states that he has poor dental hygiene has not seen dentist in a while.  He has no associated fevers chills pain with eye movement trismus torticollis, no tongue, throat, lip swelling, difficulty swallowing no chest pain shortness of breath peripheral edema.  Patient has no other complaints.  Denies any leaving or aggravating factors.  Reviewed patient's chart been seen multiple times for dental abscess, started on antibiotics and referred to a dentist.  Last time patient was seen last year.  Home Medications Prior to Admission medications   Medication Sig Start Date End Date Taking? Authorizing Provider  penicillin v potassium (VEETID) 500 MG tablet Take 1 tablet (500 mg total) by mouth 4 (four) times daily for 7 days. 11/25/21 12/02/21 Yes Carroll Sage, PA-C  amLODipine (NORVASC) 5 MG tablet Take 1 tablet by mouth once daily 06/16/21   Jacquelin Hawking, PA-C  doxycycline (VIBRA-TABS) 100 MG tablet Take 1 tablet (100 mg total) by mouth 2 (two) times daily. 06/15/21   Linwood Dibbles, MD      Allergies    Patient has no known allergies.    Review of Systems   Review of Systems  Constitutional:  Negative for chills and fever.  HENT:  Positive for dental problem. Negative for congestion, facial swelling, sore throat and tinnitus.   Respiratory:  Negative for shortness of breath.   Cardiovascular:  Negative for chest pain.  Gastrointestinal:  Negative for abdominal pain.  Neurological:  Negative for headaches.    Physical Exam Updated Vital Signs BP 130/86 (BP Location: Right Arm)    Pulse 71    Temp 97.9 F (36.6 C) (Oral)    Resp 18    SpO2 100%  Physical Exam Vitals and nursing note reviewed.  Constitutional:      General: He is not in acute distress.    Appearance: He is not ill-appearing.  HENT:     Head: Normocephalic and atraumatic.     Comments: No facial swelling present.    Nose: No congestion.     Mouth/Throat:     Comments: No trismus no torticollis oropharynx is visualized tongue and uvula were both midline, he was controlling oral secretions, no tongue elevation patient has noted poor dental hygiene, multiple dental cavities, no notable gingivitis, no palpable fluctuation induration noted on gumline. Eyes:     Extraocular Movements: Extraocular movements intact.     Conjunctiva/sclera: Conjunctivae normal.  Cardiovascular:     Rate and Rhythm: Normal rate and regular rhythm.     Pulses: Normal pulses.     Heart sounds: No murmur heard.   No friction rub. No gallop.  Pulmonary:     Effort: No respiratory distress.     Breath sounds: No wheezing, rhonchi or rales.  Skin:    General: Skin is warm and dry.  Neurological:     Mental Status: He is alert.  Psychiatric:        Mood and Affect: Mood normal.    ED  Results / Procedures / Treatments   Labs (all labs ordered are listed, but only abnormal results are displayed) Labs Reviewed - No data to display  EKG None  Radiology No results found.  Procedures Procedures    Medications Ordered in ED Medications - No data to display  ED Course/ Medical Decision Making/ A&P                           Medical Decision Making  This patient presents to the ED for concern of dental pain, this involves an extensive number of treatment options, and is a complaint that carries with it a high risk of complications and morbidity.  The differential diagnosis includes Ludwig angina, peritonsillar abscess, periorbital  cellulitis    Additional history obtained:  Additional history obtained from electronic medical record External records from outside source obtained and reviewed including please see HPI   Co morbidities that complicate the patient evaluation  N/A  Social Determinants of Health:  No health insurance    Lab Tests:  I Ordered, and personally interpreted labs.  The pertinent results include: N/A   Imaging Studies ordered:  I ordered imaging studies including N/A    Cardiac Monitoring:  The patient was maintained on a cardiac monitor.  I personally viewed and interpreted the cardiac monitored which showed an underlying rhythm of: N/A   Medicines ordered and prescription drug management:  I ordered medication including N/A I have reviewed the patients home medicines and have made adjustments as needed  Critical Interventions:  N/A   Test Considered:  N/A    Rule out I have low suspicion for peritonsillar abscess, retropharyngeal abscess, or Ludwig angina as oropharynx was visualized tongue and uvula were both midline, there is no exudates, erythema or edema noted in the posterior pillars or on/ around tonsils.  Low suspicion for drainable abscess as gumline were palpated no fluctuance or induration felt.  Low suspicion for periorbital or orbital cellulitis as patient face had no erythematous, patient EOMs were intact, he had no pain with eye movement.     Dispostion and problem list  After consideration of the diagnostic results and the patients response to treatment, I feel that the patent would benefit from discharge.  Dental pain-likely patient has a dental cavity, will start him on antibiotics, have him follow-up with a dentist for further evaluation.  Strict return precautions.            Final Clinical Impression(s) / ED Diagnoses Final diagnoses:  Pain, dental    Rx / DC Orders ED Discharge Orders          Ordered    penicillin v  potassium (VEETID) 500 MG tablet  4 times daily        11/25/21 1434              Barnie Del 11/25/21 1436    Derwood Kaplan, MD 11/30/21 1428

## 2021-11-25 NOTE — Discharge Instructions (Signed)
Likely you have a dental cavity, started you on antibiotics please take as prescribed.  Recommend over-the-counter pain medication as needed.  Please follow-up with a dentist, given the contact duration of please call  Come back to the emergency department if you develop chest pain, shortness of breath, severe abdominal pain, uncontrolled nausea, vomiting, diarrhea.

## 2022-01-09 ENCOUNTER — Other Ambulatory Visit: Payer: Self-pay | Admitting: Physician Assistant

## 2022-01-15 ENCOUNTER — Ambulatory Visit (INDEPENDENT_AMBULATORY_CARE_PROVIDER_SITE_OTHER): Payer: 59 | Admitting: Family Medicine

## 2022-01-15 ENCOUNTER — Encounter: Payer: Self-pay | Admitting: Family Medicine

## 2022-01-15 VITALS — BP 137/73 | HR 84 | Temp 98.3°F | Resp 18 | Ht 72.0 in | Wt 146.0 lb

## 2022-01-15 DIAGNOSIS — I10 Essential (primary) hypertension: Secondary | ICD-10-CM

## 2022-01-15 DIAGNOSIS — F339 Major depressive disorder, recurrent, unspecified: Secondary | ICD-10-CM | POA: Diagnosis not present

## 2022-01-15 DIAGNOSIS — F1090 Alcohol use, unspecified, uncomplicated: Secondary | ICD-10-CM

## 2022-01-15 DIAGNOSIS — F411 Generalized anxiety disorder: Secondary | ICD-10-CM

## 2022-01-15 DIAGNOSIS — Z7689 Persons encountering health services in other specified circumstances: Secondary | ICD-10-CM | POA: Diagnosis not present

## 2022-01-15 MED ORDER — LISINOPRIL 10 MG PO TABS: 10.0000 mg | ORAL_TABLET | Freq: Every day | ORAL | 3 refills | Status: DC

## 2022-01-15 MED ORDER — FLUOXETINE HCL 20 MG PO CAPS: 20.0000 mg | ORAL_CAPSULE | Freq: Every day | ORAL | 3 refills | Status: DC

## 2022-01-15 NOTE — Patient Instructions (Signed)

## 2022-01-15 NOTE — Progress Notes (Signed)
?  ? ?Subjective:  ?Patient ID: Trevor Sosa, male    DOB: [birth year], 41 y.o.   MRN: [id number] ? ?Patient Care Team: ?[name], FNP as PCP - General (Family Medicine)  ? ?Chief Complaint:  New Patient (Initial Visit) ? ? ?HPI: ?Trevor Sosa is a 42 y.o. male presenting on 01/15/2022 for New Patient (Initial Visit) ? ? ?Pt presents today to establish care with new PCP. He has not been seen by a PCP in the last 12 months. States he has been going to the ED as needed for various complaints. He has a history of hypertension, heavy alcohol consumption, and heavy tobacco use. He states he has been to [facility] in the past for alcohol rehab but was only there for 3 days. States over the last 1.5 years his drinking has increased significantly. Reports drinking at least 6-12 beers per day, sometimes more. States he can quit whenever he would like. Denies DTs or alcohol withdrawal symptoms in the past when he would stop drinking. He reports being on fluoxetine over 20 years ago but can not recall his diagnosis. He has lost a few family members over the last 6 months which has led to increased alcohol intake. He denies SI or HI.  ?He has been out of his norvasc since January and reports high BP readings at home. No headache, chest pain, leg swelling, headaches, confusion, weakness, visual changes, dizziness or syncope.  ? ? ?  01/15/2022  ? 11:45 AM  ?Depression screen PHQ 2/9  ?Decreased Interest 2  ?Down, Depressed, Hopeless 2  ?PHQ - 2 Score 4  ?Altered sleeping 0  ?Tired, decreased energy 1  ?Change in appetite 0  ?Feeling bad or failure about yourself  2  ?Trouble concentrating 2  ?Moving slowly or fidgety/restless 2  ?Suicidal thoughts 0  ?PHQ-9 Score 11  ?Difficult doing work/chores Somewhat difficult  ? ? ?  01/15/2022  ? 11:46 AM  ?GAD 7 : Generalized Anxiety Score  ?Nervous, Anxious, on Edge 1  ?Control/stop worrying 1  ?Worry too much - different things 1  ?Trouble relaxing 1  ?Restless 1  ?Easily annoyed or  irritable 3  ?Afraid - awful might happen 3  ?Total GAD 7 Score 11  ? ? ?  ? ? ?Relevant past medical, surgical, family, and social history reviewed and updated as indicated.  ?Allergies and medications reviewed and updated. Data reviewed: Chart in Epic. ? ? ?Past Medical History:  ?Diagnosis Date  ? Allergy to alpha-gal   ? No pertinent past medical history   ? ? ?Past Surgical History:  ?Procedure Laterality Date  ? CYSTECTOMY    ? Face, Chest, L knee    ? ? ?Social History  ? ?Socioeconomic History  ? Marital status: Married  ?  Spouse name: Not on file  ? Number of children: Not on file  ? Years of education: Not on file  ? Highest education level: Not on file  ?Occupational History  ? Not on file  ?Tobacco Use  ? Smoking status: Every Day  ?  Packs/day: 1.00  ?  Years: 21.00  ?  Pack years: 21.00  ?  Types: Cigarettes  ? Smokeless tobacco: Never  ?Vaping Use  ? Vaping Use: Never used  ?Substance and Sexual Activity  ? Alcohol use: Yes  ?  Alcohol/week: 48.0 standard drinks  ?  Types: 48 Cans of beer per week  ?  Comment: occ. use  ? Drug use: No  ?  Sexual activity: Yes  ?  Birth control/protection: None  ?Other Topics Concern  ? Not on file  ?Social History Narrative  ? Not on file  ? ?Social Determinants of Health  ? ?Financial Resource Strain: Not on file  ?Food Insecurity: Not on file  ?Transportation Needs: Not on file  ?Physical Activity: Not on file  ?Stress: Not on file  ?Social Connections: Not on file  ?Intimate Partner Violence: Not on file  ? ? ?Outpatient Encounter Medications as of 01/15/2022  ?Medication Sig  ? FLUoxetine (PROZAC) 20 MG capsule Take 1 capsule (20 mg total) by mouth daily.  ? lisinopril (ZESTRIL) 10 MG tablet Take 1 tablet (10 mg total) by mouth daily.  ? [DISCONTINUED] amLODipine (NORVASC) 5 MG tablet Take 1 tablet by mouth once daily  ? [DISCONTINUED] doxycycline (VIBRA-TABS) 100 MG tablet Take 1 tablet (100 mg total) by mouth 2 (two) times daily. (Patient not taking: Reported on  01/15/2022)  ? ?No facility-administered encounter medications on file as of 01/15/2022.  ? ? ?Allergies  ?Allergen Reactions  ? Alpha-Gal   ? ? ?Review of Systems  ?Constitutional:  Positive for activity change and fatigue. Negative for appetite change, chills, diaphoresis, fever and unexpected weight change.  ?Eyes:  Negative for photophobia and visual disturbance.  ?Respiratory:  Negative for cough and shortness of breath.   ?Cardiovascular:  Negative for chest pain, palpitations and leg swelling.  ?Gastrointestinal:  Negative for abdominal distention, abdominal pain, anal bleeding, blood in stool, constipation, diarrhea, nausea, rectal pain and vomiting.  ?Endocrine: Negative for cold intolerance, heat intolerance, polydipsia, polyphagia and polyuria.  ?Genitourinary:  Negative for decreased urine volume and difficulty urinating.  ?Musculoskeletal:  Negative for arthralgias and myalgias.  ?Neurological:  Negative for dizziness, tremors, seizures, syncope, facial asymmetry, speech difficulty, weakness, light-headedness, numbness and headaches.  ?Psychiatric/Behavioral:  Positive for agitation, decreased concentration and dysphoric mood. Negative for behavioral problems, confusion, hallucinations, self-injury, sleep disturbance and suicidal ideas. The patient is nervous/anxious. The patient is not hyperactive.   ?All other systems reviewed and are negative. ? ?   ? ?Objective:  ?BP 137/73   Pulse 84   Temp 98.3 ?F (36.8 ?C)   Resp 18   Ht 6' (1.829 m)   Wt 146 lb (66.2 kg)   SpO2 98%   BMI 19.80 kg/m?   ? ?Wt Readings from Last 3 Encounters:  ?01/15/22 146 lb (66.2 kg)  ?06/15/21 147 lb 12.8 oz (67 kg)  ?01/08/21 149 lb (67.6 kg)  ? ? ?Physical Exam ?Vitals and nursing note reviewed.  ?Constitutional:   ?   General: He is not in acute distress. ?   Appearance: Normal appearance. He is well-developed and well-groomed. He is not ill-appearing, toxic-appearing or diaphoretic.  ?HENT:  ?   Head: Normocephalic and  atraumatic.  ?   Jaw: There is normal jaw occlusion.  ?   Right Ear: Hearing normal.  ?   Left Ear: Hearing normal.  ?   Nose: Nose normal.  ?   Mouth/Throat:  ?   Lips: Pink.  ?   Mouth: Mucous membranes are moist.  ?   Dentition: Abnormal dentition. Dental caries present.  ?   Pharynx: Oropharynx is clear. Uvula midline.  ?Eyes:  ?   General: Lids are normal.  ?   Extraocular Movements: Extraocular movements intact.  ?   Conjunctiva/sclera: Conjunctivae normal.  ?   Pupils: Pupils are equal, round, and reactive to light.  ?Neck:  ?   Thyroid:  No thyroid mass, thyromegaly or thyroid tenderness.  ?   Vascular: No carotid bruit or JVD.  ?   Trachea: Trachea and phonation normal.  ?Cardiovascular:  ?   Rate and Rhythm: Normal rate and regular rhythm.  ?   Chest Wall: PMI is not displaced.  ?   Pulses: Normal pulses.  ?   Heart sounds: Normal heart sounds. No murmur heard. ?  No friction rub. No gallop.  ?Pulmonary:  ?   Effort: Pulmonary effort is normal. No respiratory distress.  ?   Breath sounds: Normal breath sounds. No wheezing.  ?Abdominal:  ?   General: Bowel sounds are normal. There is no distension or abdominal bruit.  ?   Palpations: Abdomen is soft. There is no hepatomegaly or splenomegaly.  ?   Tenderness: There is no abdominal tenderness. There is no right CVA tenderness or left CVA tenderness.  ?   Hernia: No hernia is present.  ?Musculoskeletal:     ?   General: Normal range of motion.  ?   Cervical back: Normal range of motion and neck supple.  ?   Right lower leg: No edema.  ?   Left lower leg: No edema.  ?Lymphadenopathy:  ?   Cervical: No cervical adenopathy.  ?Skin: ?   General: Skin is warm and dry.  ?   Capillary Refill: Capillary refill takes less than 2 seconds.  ?   Coloration: Skin is not cyanotic, jaundiced or pale.  ?   Findings: No rash.  ?Neurological:  ?   General: No focal deficit present.  ?   Mental Status: He is alert and oriented to person, place, and time.  ?   Sensory: Sensation  is intact.  ?   Motor: Motor function is intact.  ?   Coordination: Coordination is intact.  ?   Gait: Gait is intact.  ?   Deep Tendon Reflexes: Reflexes are normal and symmetric.  ?Psychiatric:     ?   Att

## 2022-01-18 LAB — FOLATE: Folate: 14.9 ng/mL (ref >3.0)

## 2022-01-18 LAB — CBC WITH DIFFERENTIAL/PLATELET
Basophils Absolute: 0 10*3/uL (ref 0.0–0.2)
Basos: 0 %
EOS (ABSOLUTE): 0.1 10*3/uL (ref 0.0–0.4)
Eos: 1 %
Hematocrit: 44.2 % (ref 37.5–51.0)
Hemoglobin: 15.2 g/dL (ref 13.0–17.7)
Immature Grans (Abs): 0 10*3/uL (ref 0.0–0.1)
Immature Granulocytes: 0 %
Lymphocytes Absolute: 1.3 10*3/uL (ref 0.7–3.1)
Lymphs: 24 %
MCH: 32.3 pg (ref 26.6–33.0)
MCHC: 34.4 g/dL (ref 31.5–35.7)
MCV: 94 fL (ref 79–97)
Monocytes Absolute: 0.5 10*3/uL (ref 0.1–0.9)
Monocytes: 9 %
Neutrophils Absolute: 3.4 10*3/uL (ref 1.4–7.0)
Neutrophils: 66 %
Platelets: 299 10*3/uL (ref 150–450)
RBC: 4.7 x10E6/uL (ref 4.14–5.80)
RDW: 11.9 % (ref 11.6–15.4)
WBC: 5.3 10*3/uL (ref 3.4–10.8)

## 2022-01-18 LAB — LIPID PANEL
Chol/HDL Ratio: 3.3 ratio (ref 0.0–5.0)
Cholesterol, Total: 218 mg/dL — ABNORMAL HIGH (ref 100–199)
HDL: 66 mg/dL (ref >39)
LDL Chol Calc (NIH): 136 mg/dL — ABNORMAL HIGH (ref 0–99)
Triglycerides: 88 mg/dL (ref 0–149)
VLDL Cholesterol Cal: 16 mg/dL (ref 5–40)

## 2022-01-18 LAB — CMP14+EGFR
ALT: 29 IU/L (ref 0–44)
AST: 29 IU/L (ref 0–40)
Albumin/Globulin Ratio: 2.1 (ref 1.2–2.2)
Albumin: 4.9 g/dL (ref 4.0–5.0)
Alkaline Phosphatase: 91 IU/L (ref 44–121)
BUN/Creatinine Ratio: 10 (ref 9–20)
BUN: 9 mg/dL (ref 6–24)
Bilirubin Total: 0.6 mg/dL (ref 0.0–1.2)
CO2: 27 mmol/L (ref 20–29)
Calcium: 9.9 mg/dL (ref 8.7–10.2)
Chloride: 100 mmol/L (ref 96–106)
Creatinine, Ser: 0.88 mg/dL (ref 0.76–1.27)
Globulin, Total: 2.3 g/dL (ref 1.5–4.5)
Glucose: 137 mg/dL — ABNORMAL HIGH (ref 70–99)
Potassium: 4.5 mmol/L (ref 3.5–5.2)
Sodium: 139 mmol/L (ref 134–144)
Total Protein: 7.2 g/dL (ref 6.0–8.5)
eGFR: 110 mL/min/{1.73_m2} (ref >59)

## 2022-01-18 LAB — VITAMIN D 25 HYDROXY (VIT D DEFICIENCY, FRACTURES): Vit D, 25-Hydroxy: 22.1 ng/mL — ABNORMAL LOW (ref 30.0–100.0)

## 2022-01-18 LAB — THYROID PANEL WITH TSH
Free Thyroxine Index: 1.2 (ref 1.2–4.9)
T3 Uptake Ratio: 28 % (ref 24–39)
T4, Total: 4.2 ug/dL — ABNORMAL LOW (ref 4.5–12.0)
TSH: 0.793 u[IU]/mL (ref 0.450–4.500)

## 2022-01-18 LAB — VITAMIN B1: Thiamine: 174.6 nmol/L (ref 66.5–200.0)

## 2022-01-18 LAB — VITAMIN B12: Vitamin B-12: 198 pg/mL — ABNORMAL LOW (ref 232–1245)

## 2022-01-20 LAB — SPECIMEN STATUS REPORT

## 2022-01-20 LAB — HGB A1C W/O EAG: Hgb A1c MFr Bld: 5.1 % (ref 4.8–5.6)

## 2022-02-12 ENCOUNTER — Ambulatory Visit: Payer: 59 | Admitting: Family Medicine

## 2022-04-30 ENCOUNTER — Telehealth: Payer: Self-pay | Admitting: *Deleted

## 2022-04-30 ENCOUNTER — Other Ambulatory Visit: Payer: Self-pay | Admitting: *Deleted

## 2022-04-30 DIAGNOSIS — I10 Essential (primary) hypertension: Secondary | ICD-10-CM

## 2022-04-30 NOTE — Telephone Encounter (Signed)
Spoke to patient about PA for [employer].  At the end of the call she states her husband Oaklee needs refills for lisinopril

## 2022-05-01 ENCOUNTER — Other Ambulatory Visit: Payer: Self-pay | Admitting: Family Medicine

## 2022-05-01 DIAGNOSIS — I10 Essential (primary) hypertension: Secondary | ICD-10-CM

## 2022-05-01 MED ORDER — LISINOPRIL 10 MG PO TABS: 10.0000 mg | ORAL_TABLET | Freq: Every day | ORAL | 3 refills | Status: DC

## 2022-10-20 ENCOUNTER — Ambulatory Visit (INDEPENDENT_AMBULATORY_CARE_PROVIDER_SITE_OTHER): Payer: Medicaid Other | Admitting: Family Medicine

## 2022-10-20 ENCOUNTER — Encounter: Payer: Self-pay | Admitting: Family Medicine

## 2022-10-20 VITALS — BP 148/94 | HR 79 | Temp 98.7°F | Ht 73.0 in | Wt 143.8 lb

## 2022-10-20 DIAGNOSIS — I10 Essential (primary) hypertension: Secondary | ICD-10-CM

## 2022-10-20 DIAGNOSIS — H539 Unspecified visual disturbance: Secondary | ICD-10-CM | POA: Diagnosis not present

## 2022-10-20 DIAGNOSIS — F411 Generalized anxiety disorder: Secondary | ICD-10-CM | POA: Diagnosis not present

## 2022-10-20 DIAGNOSIS — F339 Major depressive disorder, recurrent, unspecified: Secondary | ICD-10-CM | POA: Diagnosis not present

## 2022-10-20 DIAGNOSIS — G453 Amaurosis fugax: Secondary | ICD-10-CM

## 2022-10-20 MED ORDER — LISINOPRIL 10 MG PO TABS: 10.0000 mg | ORAL_TABLET | Freq: Every day | ORAL | 3 refills | Status: DC

## 2022-10-20 NOTE — Progress Notes (Signed)
Subjective:  Patient ID: Trevor Sosa, male    DOB: [birth year], 42 y.o.   MRN: [id number]  Patient Care Team: [name], FNP as PCP - General (Family Medicine)   Chief Complaint:  Eye Problem (Patient states that since he had COVID in September 2022 he has been having trouble with vision in right eye. ) and Medical Management of Chronic Issues   HPI: Trevor Sosa is a 42 y.o. male presenting on 10/20/2022 for Eye Problem (Patient states that since he had COVID in September 2022 he has been having trouble with vision in right eye. ) and Medical Management of Chronic Issues   1. Primary hypertension States he has not been taking his blood pressure medications the last few days. Does have visual changes but denies any other symptoms. States he has had issues with his right eye vision since 05/2021 after having COVID. He was evaluated by Dr. Conley Rolls pertaining to this a few weeks ago and was told to see his PCP. He states he has a darkened area to his vision (states like a curtain covering the upper part of his right vision or lower part. No associated pain or other focal neurological deficits. States sister does have MS  2. Depression, recurrent (HCC) 3. GAD (generalized anxiety disorder) Did not start SSRI therapy and does not feel he needs to. States he feels ok. Has cut back on his drinking but still drinks daily. No SI or HI. Does not wish to get help with his drinking.     10/20/2022    3:32 PM 01/15/2022   11:46 AM  GAD 7 : Generalized Anxiety Score  Nervous, Anxious, on Edge 0 1  Control/stop worrying 0 1  Worry too much - different things 0 1  Trouble relaxing 0 1  Restless 0 1  Easily annoyed or irritable 0 3  Afraid - awful might happen 0 3  Total GAD 7 Score 0 11  Anxiety Difficulty Not difficult at all        10/20/2022    3:32 PM 01/15/2022   11:45 AM  Depression screen PHQ 2/9  Decreased Interest 3 2  Down, Depressed, Hopeless 0 2  PHQ - 2 Score 3 4  Altered  sleeping 0 0  Tired, decreased energy 0 1  Change in appetite 0 0  Feeling bad or failure about yourself  0 2  Trouble concentrating 0 2  Moving slowly or fidgety/restless 0 2  Suicidal thoughts 0 0  PHQ-9 Score 3 11  Difficult doing work/chores Not difficult at all Somewhat difficult      Relevant past medical, surgical, family, and social history reviewed and updated as indicated.  Allergies and medications reviewed and updated. Data reviewed: Chart in Epic.   Past Medical History:  Diagnosis Date   Allergy to alpha-gal    No pertinent past medical history     Past Surgical History:  Procedure Laterality Date   CYSTECTOMY     Face, Chest, L knee      Social History   Socioeconomic History   Marital status: Married    Spouse name: Not on file   Number of children: Not on file   Years of education: Not on file   Highest education level: Not on file  Occupational History   Not on file  Tobacco Use   Smoking status: Every Day    Packs/day: 1.00    Years: 21.00    Total pack years:  21.00    Types: Cigarettes   Smokeless tobacco: Never  Vaping Use   Vaping Use: Never used  Substance and Sexual Activity   Alcohol use: Yes    Alcohol/week: 48.0 standard drinks of alcohol    Types: 48 Cans of beer per week    Comment: occ. use   Drug use: No   Sexual activity: Yes    Birth control/protection: None  Other Topics Concern   Not on file  Social History Narrative   Not on file   Social Determinants of Health   Financial Resource Strain: Not on file  Food Insecurity: Not on file  Transportation Needs: Not on file  Physical Activity: Not on file  Stress: Not on file  Social Connections: Not on file  Intimate Partner Violence: Not on file    Outpatient Encounter Medications as of 10/20/2022  Medication Sig   [DISCONTINUED] lisinopril (ZESTRIL) 10 MG tablet Take 1 tablet (10 mg total) by mouth daily.   lisinopril (ZESTRIL) 10 MG tablet Take 1 tablet (10 mg  total) by mouth daily.   [DISCONTINUED] FLUoxetine (PROZAC) 20 MG capsule Take 1 capsule (20 mg total) by mouth daily. (Patient not taking: Reported on 10/20/2022)   No facility-administered encounter medications on file as of 10/20/2022.    Allergies  Allergen Reactions   Alpha-Gal     Review of Systems  Constitutional:  Positive for activity change. Negative for appetite change, chills, diaphoresis, fatigue, fever and unexpected weight change.  HENT: Negative.    Eyes:  Positive for visual disturbance. Negative for photophobia, pain, discharge, redness and itching.  Respiratory:  Negative for apnea, cough, choking, chest tightness, shortness of breath, wheezing and stridor.   Cardiovascular:  Negative for chest pain, palpitations and leg swelling.  Gastrointestinal:  Negative for abdominal pain, blood in stool, constipation, diarrhea, nausea and vomiting.  Endocrine: Negative.  Negative for polydipsia, polyphagia and polyuria.  Genitourinary:  Negative for decreased urine volume, difficulty urinating, dysuria, frequency and urgency.  Musculoskeletal:  Negative for arthralgias and myalgias.  Skin: Negative.   Allergic/Immunologic: Negative.   Neurological:  Negative for dizziness, tremors, seizures, syncope, facial asymmetry, speech difficulty, weakness, light-headedness, numbness and headaches.  Hematological: Negative.   Psychiatric/Behavioral:  Negative for confusion, hallucinations, sleep disturbance and suicidal ideas.   All other systems reviewed and are negative.       Objective:  BP (!) 148/94   Pulse 79   Temp 98.7 F (37.1 C) (Temporal)   Ht 6\' 1"  (1.854 m)   Wt 143 lb 12.8 oz (65.2 kg)   SpO2 100%   BMI 18.97 kg/m    Wt Readings from Last 3 Encounters:  10/20/22 143 lb 12.8 oz (65.2 kg)  01/15/22 146 lb (66.2 kg)  06/15/21 147 lb 12.8 oz (67 kg)    Physical Exam Vitals and nursing note reviewed.  Constitutional:      General: He is not in acute distress.     Appearance: Normal appearance. He is well-developed, well-groomed and normal weight. He is not ill-appearing, toxic-appearing or diaphoretic.  HENT:     Head: Normocephalic and atraumatic.     Jaw: There is normal jaw occlusion.     Right Ear: Hearing normal.     Left Ear: Hearing normal.     Nose: Nose normal.     Mouth/Throat:     Lips: Pink.     Mouth: Mucous membranes are moist.     Dentition: Abnormal dentition. Dental caries present.  Pharynx: Oropharynx is clear. Uvula midline.  Eyes:     General: Lids are normal. Visual field deficit present. No scleral icterus.    Extraocular Movements: Extraocular movements intact.     Conjunctiva/sclera: Conjunctivae normal.     Pupils: Pupils are equal, round, and reactive to light.  Neck:     Thyroid: No thyroid mass, thyromegaly or thyroid tenderness.     Vascular: No carotid bruit or JVD.     Trachea: Trachea and phonation normal.  Cardiovascular:     Rate and Rhythm: Normal rate and regular rhythm.     Chest Wall: PMI is not displaced.     Pulses: Normal pulses.     Heart sounds: Normal heart sounds. No murmur heard.    No friction rub. No gallop.  Pulmonary:     Effort: Pulmonary effort is normal. No respiratory distress.     Breath sounds: Normal breath sounds. No wheezing.  Abdominal:     General: Bowel sounds are normal. There is no abdominal bruit.     Palpations: Abdomen is soft. There is no hepatomegaly or splenomegaly.  Musculoskeletal:        General: Normal range of motion.     Cervical back: Normal range of motion and neck supple.     Right lower leg: No edema.     Left lower leg: No edema.  Lymphadenopathy:     Cervical: No cervical adenopathy.  Skin:    General: Skin is warm and dry.     Capillary Refill: Capillary refill takes less than 2 seconds.     Coloration: Skin is not cyanotic, jaundiced or pale.     Findings: No rash.  Neurological:     General: No focal deficit present.     Mental Status: He  is alert and oriented to person, place, and time.     Sensory: Sensation is intact.     Motor: Motor function is intact.     Coordination: Coordination is intact.     Gait: Gait is intact.     Deep Tendon Reflexes: Reflexes are normal and symmetric.  Psychiatric:        Attention and Perception: Attention and perception normal.        Mood and Affect: Mood and affect normal.        Speech: Speech normal.        Behavior: Behavior normal. Behavior is cooperative.        Thought Content: Thought content normal.        Cognition and Memory: Cognition and memory normal.        Judgment: Judgment normal.     Results for orders placed or performed in visit on 01/15/22  CBC with Differential/Platelet  Result Value Ref Range   WBC 5.3 3.4 - 10.8 x10E3/uL   RBC 4.70 4.14 - 5.80 x10E6/uL   Hemoglobin 15.2 13.0 - 17.7 g/dL   Hematocrit 44.2 37.5 - 51.0 %   MCV 94 79 - 97 fL   MCH 32.3 26.6 - 33.0 pg   MCHC 34.4 31.5 - 35.7 g/dL   RDW 11.9 11.6 - 15.4 %   Platelets 299 150 - 450 x10E3/uL   Neutrophils 66 Not Estab. %   Lymphs 24 Not Estab. %   Monocytes 9 Not Estab. %   Eos 1 Not Estab. %   Basos 0 Not Estab. %   Neutrophils Absolute 3.4 1.4 - 7.0 x10E3/uL   Lymphocytes Absolute 1.3 0.7 - 3.1 x10E3/uL  Monocytes Absolute 0.5 0.1 - 0.9 x10E3/uL   EOS (ABSOLUTE) 0.1 0.0 - 0.4 x10E3/uL   Basophils Absolute 0.0 0.0 - 0.2 x10E3/uL   Immature Granulocytes 0 Not Estab. %   Immature Grans (Abs) 0.0 0.0 - 0.1 x10E3/uL  CMP14+EGFR  Result Value Ref Range   Glucose 137 (H) 70 - 99 mg/dL   BUN 9 6 - 24 mg/dL   Creatinine, Ser 4.09 0.76 - 1.27 mg/dL   eGFR 735 >32 DJ/MEQ/6.83   BUN/Creatinine Ratio 10 9 - 20   Sodium 139 134 - 144 mmol/L   Potassium 4.5 3.5 - 5.2 mmol/L   Chloride 100 96 - 106 mmol/L   CO2 27 20 - 29 mmol/L   Calcium 9.9 8.7 - 10.2 mg/dL   Total Protein 7.2 6.0 - 8.5 g/dL   Albumin 4.9 4.0 - 5.0 g/dL   Globulin, Total 2.3 1.5 - 4.5 g/dL   Albumin/Globulin Ratio 2.1  1.2 - 2.2   Bilirubin Total 0.6 0.0 - 1.2 mg/dL   Alkaline Phosphatase 91 44 - 121 IU/L   AST 29 0 - 40 IU/L   ALT 29 0 - 44 IU/L  Lipid panel  Result Value Ref Range   Cholesterol, Total 218 (H) 100 - 199 mg/dL   Triglycerides 88 0 - 149 mg/dL   HDL 66 >41 mg/dL   VLDL Cholesterol Cal 16 5 - 40 mg/dL   LDL Chol Calc (NIH) 962 (H) 0 - 99 mg/dL   Chol/HDL Ratio 3.3 0.0 - 5.0 ratio  Thyroid Panel With TSH  Result Value Ref Range   TSH 0.793 0.450 - 4.500 uIU/mL   T4, Total 4.2 (L) 4.5 - 12.0 ug/dL   T3 Uptake Ratio 28 24 - 39 %   Free Thyroxine Index 1.2 1.2 - 4.9  VITAMIN D 25 Hydroxy (Vit-D Deficiency, Fractures)  Result Value Ref Range   Vit D, 25-Hydroxy 22.1 (L) 30.0 - 100.0 ng/mL  Vitamin B12  Result Value Ref Range   Vitamin B-12 198 (L) 232 - 1,245 pg/mL  Folate  Result Value Ref Range   Folate 14.9 >3.0 ng/mL  Vitamin B1  Result Value Ref Range   Thiamine 174.6 66.5 - 200.0 nmol/L  Hgb A1c w/o eAG  Result Value Ref Range   Hgb A1c MFr Bld 5.1 4.8 - 5.6 %  Specimen status report  Result Value Ref Range   specimen status report Comment        Pertinent labs & imaging results that were available during my care of the patient were reviewed by me and considered in my medical decision making.  Assessment & Plan:  Trevor Sosa was seen today for eye problem and medical management of chronic issues.  Diagnoses and all orders for this visit:  Primary hypertension Has not been taking medications as prescribed. Aware of importance of medication compliance. Due to visual changes, will get MR of head to evaluate for retinal detachment, ocular occlusion, or possible MS/other brain anomaly. -     lisinopril (ZESTRIL) 10 MG tablet; Take 1 tablet (10 mg total) by mouth daily. -     CBC with Differential/Platelet -     CMP14+EGFR -     Lipid panel -     Thyroid Panel With TSH -     MR MRV HEAD W WO CONTRAST; Future  Depression, recurrent (HCC) GAD (generalized anxiety  disorder) Never started SSRI therapy, feels he does not need the medications. Does not wish to start therapy today.  Vision changes Amaurosis fugax of right eye Due to visual changes, will get MR of head to evaluate for retinal detachment, ocular occlusion, or possible MS/other brain anomaly. Sister does have a history of MS Vision Screening   Right eye Left eye Both eyes  Without correction 20/70 20/30 20/30   With correction     -     CBC with Differential/Platelet -     CMP14+EGFR -     Lipid panel -     Thyroid Panel With TSH -     MR MRV HEAD W WO CONTRAST; Future     Continue all other maintenance medications.  Follow up plan: Return in about 6 months (around 04/20/2023), or if symptoms worsen or fail to improve, for HTN.   Continue healthy lifestyle choices, including diet (rich in fruits, vegetables, and lean proteins, and low in salt and simple carbohydrates) and exercise (at least 30 minutes of moderate physical activity daily).  Educational handout given for DASH diet, HTN  The above assessment and management plan was discussed with the patient. The patient verbalized understanding of and has agreed to the management plan. Patient is aware to call the clinic if they develop any new symptoms or if symptoms persist or worsen. Patient is aware when to return to the clinic for a follow-up visit. Patient educated on when it is appropriate to go to the emergency department.   04/22/2023, FNP-C [facility] [phone]

## 2022-10-20 NOTE — Patient Instructions (Signed)

## 2022-10-21 ENCOUNTER — Ambulatory Visit (HOSPITAL_COMMUNITY)
Admission: RE | Admit: 2022-10-21 | Discharge: 2022-10-21 | Disposition: A | Discharge status: Home / Self Care | Payer: Medicaid Other | Source: Ambulatory Visit | Attending: Family Medicine | Admitting: Family Medicine

## 2022-10-21 ENCOUNTER — Other Ambulatory Visit: Payer: Self-pay | Admitting: Family Medicine

## 2022-10-21 DIAGNOSIS — H539 Unspecified visual disturbance: Secondary | ICD-10-CM

## 2022-10-21 DIAGNOSIS — G453 Amaurosis fugax: Secondary | ICD-10-CM | POA: Insufficient documentation

## 2022-10-21 DIAGNOSIS — I10 Essential (primary) hypertension: Secondary | ICD-10-CM | POA: Insufficient documentation

## 2022-10-21 LAB — CMP14+EGFR
ALT: 12 IU/L (ref 0–44)
AST: 15 IU/L (ref 0–40)
Albumin/Globulin Ratio: 2.2 (ref 1.2–2.2)
Albumin: 5 g/dL (ref 4.1–5.1)
Alkaline Phosphatase: 92 IU/L (ref 44–121)
BUN/Creatinine Ratio: 8 — ABNORMAL LOW (ref 9–20)
BUN: 7 mg/dL (ref 6–24)
Bilirubin Total: 0.6 mg/dL (ref 0.0–1.2)
CO2: 24 mmol/L (ref 20–29)
Calcium: 9.9 mg/dL (ref 8.7–10.2)
Chloride: 101 mmol/L (ref 96–106)
Creatinine, Ser: 0.85 mg/dL (ref 0.76–1.27)
Globulin, Total: 2.3 g/dL (ref 1.5–4.5)
Glucose: 94 mg/dL (ref 70–99)
Potassium: 4.3 mmol/L (ref 3.5–5.2)
Sodium: 141 mmol/L (ref 134–144)
Total Protein: 7.3 g/dL (ref 6.0–8.5)
eGFR: 111 mL/min/{1.73_m2} (ref >59)

## 2022-10-21 LAB — CBC WITH DIFFERENTIAL/PLATELET
Basophils Absolute: 0 10*3/uL (ref 0.0–0.2)
Basos: 0 %
EOS (ABSOLUTE): 0 10*3/uL (ref 0.0–0.4)
Eos: 0 %
Hematocrit: 46.3 % (ref 37.5–51.0)
Hemoglobin: 15.3 g/dL (ref 13.0–17.7)
Immature Grans (Abs): 0 10*3/uL (ref 0.0–0.1)
Immature Granulocytes: 0 %
Lymphocytes Absolute: 2.1 10*3/uL (ref 0.7–3.1)
Lymphs: 27 %
MCH: 31.3 pg (ref 26.6–33.0)
MCHC: 33 g/dL (ref 31.5–35.7)
MCV: 95 fL (ref 79–97)
Monocytes Absolute: 0.8 10*3/uL (ref 0.1–0.9)
Monocytes: 10 %
Neutrophils Absolute: 4.7 10*3/uL (ref 1.4–7.0)
Neutrophils: 63 %
Platelets: 292 10*3/uL (ref 150–450)
RBC: 4.89 x10E6/uL (ref 4.14–5.80)
RDW: 12.2 % (ref 11.6–15.4)
WBC: 7.6 10*3/uL (ref 3.4–10.8)

## 2022-10-21 LAB — THYROID PANEL WITH TSH
Free Thyroxine Index: 1.5 (ref 1.2–4.9)
T3 Uptake Ratio: 30 % (ref 24–39)
T4, Total: 5 ug/dL (ref 4.5–12.0)
TSH: 1.36 u[IU]/mL (ref 0.450–4.500)

## 2022-10-21 LAB — LIPID PANEL
Chol/HDL Ratio: 2.9 ratio (ref 0.0–5.0)
Cholesterol, Total: 198 mg/dL (ref 100–199)
HDL: 68 mg/dL (ref >39)
LDL Chol Calc (NIH): 116 mg/dL — ABNORMAL HIGH (ref 0–99)
Triglycerides: 77 mg/dL (ref 0–149)
VLDL Cholesterol Cal: 14 mg/dL (ref 5–40)

## 2022-10-21 MED ORDER — GADOBUTROL 1 MMOL/ML IV SOLN
8.0000 mL | Freq: Once | INTRAVENOUS | Status: AC | PRN
  Administered 2022-10-21: 8 mL via INTRAVENOUS

## 2022-10-21 NOTE — Addendum Note (Signed)
Addended by: Baruch Gouty on: 10/21/2022 02:52 PM   Modules accepted: Orders

## 2023-03-07 ENCOUNTER — Other Ambulatory Visit: Payer: Self-pay

## 2023-03-07 ENCOUNTER — Emergency Department (HOSPITAL_COMMUNITY)
Admission: EM | Admit: 2023-03-07 | Discharge: 2023-03-07 | Disposition: A | Discharge status: Home / Self Care | Payer: Medicaid Other | Attending: Emergency Medicine | Admitting: Emergency Medicine

## 2023-03-07 ENCOUNTER — Encounter (HOSPITAL_COMMUNITY): Payer: Self-pay | Admitting: *Deleted

## 2023-03-07 DIAGNOSIS — Z79899 Other long term (current) drug therapy: Secondary | ICD-10-CM | POA: Insufficient documentation

## 2023-03-07 DIAGNOSIS — K649 Unspecified hemorrhoids: Secondary | ICD-10-CM | POA: Diagnosis not present

## 2023-03-07 DIAGNOSIS — R197 Diarrhea, unspecified: Secondary | ICD-10-CM | POA: Insufficient documentation

## 2023-03-07 MED ORDER — HYDROCORTISONE (PERIANAL) 2.5 % EX CREA: 1.0000 | TOPICAL_CREAM | Freq: Two times a day (BID) | CUTANEOUS | 0 refills | Status: AC

## 2023-03-07 NOTE — ED Triage Notes (Signed)
Pt with burning pain to hemorrhoids for past 2 days.

## 2023-03-07 NOTE — ED Provider Notes (Signed)
[facility] EMERGENCY DEPARTMENT AT [facility] Provider Note   CSN: [id number] Arrival date & time: 03/07/23  1129     History  Chief Complaint  Patient presents with   Hemorrhoids    Trevor Sosa is a 42 y.o. male who presents with anal burning and itching for the past 4 days.  He states he has also been seeing some blood when he wipes.  Denies any bloody stools or tarry stools.  Denies constipation.  He states he has been having diarrhea for the past couple of weeks and having to use the bathroom more often than normal.  He states he usually has a bowel movement 3 times a week and now is having diarrhea every day.  Is any abdominal pain, fever, chills, urinary complaints.  HPI     Home Medications Prior to Admission medications   Medication Sig Start Date End Date Taking? Authorizing Provider  lisinopril (ZESTRIL) 10 MG tablet Take 1 tablet (10 mg total) by mouth daily. 10/20/22   [name], FNP      Allergies    Alpha-gal    Review of Systems   Review of Systems  Constitutional:  Negative for chills.  Gastrointestinal:  Positive for anal bleeding, diarrhea and rectal pain. Negative for abdominal pain, blood in stool and constipation.    Physical Exam Updated Vital Signs There were no vitals taken for this visit. Physical Exam Vitals and nursing note reviewed. Exam conducted with a chaperone present.  Constitutional:      General: He is not in acute distress.    Appearance: Normal appearance.  Cardiovascular:     Rate and Rhythm: Normal rate and regular rhythm.  Pulmonary:     Effort: Pulmonary effort is normal.     Breath sounds: Normal breath sounds.  Abdominal:     General: Abdomen is flat. Bowel sounds are normal.     Palpations: Abdomen is soft.  Genitourinary:    Comments: Nonthrombosed hemorrhoid appreciated at the 3 o'clock position. Neurological:     Mental Status: He is alert.     ED Results / Procedures / Treatments   Labs (all  labs ordered are listed, but only abnormal results are displayed) Labs Reviewed - No data to display  EKG None  Radiology No results found.  Procedures Procedures    Medications Ordered in ED Medications - No data to display  ED Course/ Medical Decision Making/ A&P                             Medical Decision Making Risk Prescription drug management.     42 y.o. male presents to the ED for concern of anal pain and itching  Differential diagnosis includes but is not limited to hemorrhoid, constipation, diarrhea, anal fissure. Likely hemorrhoid or anal fissure given patient description of pain, itching, and BRBPR when he wipes.  Suspect diarrhea is contributory given it has been going on for a couple weeks without any normal bowel movements. He denies having constipation.  ED Course:  Exam performed with chaperone present.  Hemorrhoid appreciated at the 3 o'clock position.     The patient was discharged home with a hydrocortisone cream to apply to his hemorrhoid.  We discussed refraining from straining when using the bathroom, increasing water intake, increasing fiber intake. We discussed sitz bath's.  Instructions to follow-up with PCP that should his pain not resolve in the next week.  Impression: Nonthrombosed hemorrhoid            Final Clinical Impression(s) / ED Diagnoses Final diagnoses:  None    Rx / DC Orders ED Discharge Orders     None         Arabella Merles, Cordelia Poche 03/07/23 1241    Eber Hong, MD 03/09/23 [phone]

## 2023-03-07 NOTE — Discharge Instructions (Addendum)
You have a hemorrhoid is causing your symptoms.  Refrain from straining when using the bathroom.  Increase water and fiber intake. You may soak your bottom in warm water which should help with the pain.  You have been prescribed a hydrocortisone cream.  Please apply this cream twice a day on your anal opening where you are having the pain.    If you are still having pain and itching in 1 week please contact your primary care provider for an appointment to discuss further treatment for your hemorrhoid.

## 2023-03-08 ENCOUNTER — Telehealth: Payer: Self-pay

## 2023-03-10 NOTE — Transitions of Care (Post Inpatient/ED Visit) (Signed)
   03/10/2023  Name: MALIKE FOGLIO MRN: [id number] DOB: 08/23/1981  Today's TOC FU Call Status: Today's TOC FU Call Status:: Successful TOC FU Call Competed TOC FU Call Complete Date: 03/10/23  Transition Care Management Follow-up Telephone Call Date of Discharge: 03/07/23 [facility] (hemmorrhoids) Type of Discharge: Emergency Department Reason for ED Visit: Other: How have you been since you were released from the hospital?: Better Any questions or concerns?: No  Items Reviewed: Did you receive and understand the discharge instructions provided?: Yes Medications obtained,verified, and reconciled?: Yes (Medications Reviewed) Any new allergies since your discharge?: No Dietary orders reviewed?: NA  Medications Reviewed Today: Medications Reviewed Today     Reviewed by Annabell Sabal, CMA (Certified Medical Assistant) on 03/10/23 at 1551  Med List Status: <None>   Medication Order Taking? Sig Documenting Provider Last Dose Status Informant  hydrocortisone (ANUSOL-HC) 2.5 % rectal cream [id number] Yes Apply 1 Application topically 2 (two) times daily for 7 days. Apply the cream to your rectum twice daily to help with pain and itching Arabella Merles, PA-C Taking Active   lisinopril (ZESTRIL) 10 MG tablet [id number] Yes Take 1 tablet (10 mg total) by mouth daily. [name], FNP Taking Active             Home Care and Equipment/Supplies: Were Home Health Services Ordered?: No Any new equipment or medical supplies ordered?: No  Functional Questionnaire: Do you need assistance with bathing/showering or dressing?: No Do you need assistance with meal preparation?: No Do you need assistance with eating?: No Do you have difficulty maintaining continence: No Do you need assistance with getting out of bed/getting out of a chair/moving?: No Do you have difficulty managing or taking your medications?: No  Follow up appointments reviewed: PCP Follow-up  appointment confirmed?: NA Specialist Hospital Follow-up appointment confirmed?: NA Do you need transportation to your follow-up appointment?: No Do you understand care options if your condition(s) worsen?: Yes-patient verbalized understanding    SIGNATURE Fredirick Maudlin

## 2023-03-26 ENCOUNTER — Other Ambulatory Visit: Payer: Self-pay

## 2023-03-26 ENCOUNTER — Telehealth: Payer: Self-pay

## 2023-03-26 DIAGNOSIS — I10 Essential (primary) hypertension: Secondary | ICD-10-CM

## 2023-03-26 MED ORDER — LISINOPRIL 10 MG PO TABS: 10.0000 mg | ORAL_TABLET | Freq: Every day | ORAL | 0 refills | Status: DC

## 2023-03-26 NOTE — Telephone Encounter (Signed)
30 day supply sent to pharmacy. Make sure follow up is scheduled.

## 2023-03-26 NOTE — Telephone Encounter (Signed)
Patient's wife called, patient has been out of his Lisinopril for several weeks, maybe a month or so, and blood pressure today is 164/95.  Requesting refill.  Patient's last office visit was in January and was told to return in July .  Refill sent to [facility], patient told to monitor blood pressure over weekend and if it does not improve or becomes more elevated to go to ER.  Also told he will need to be seen Monday or Tuesday if no improvement, and told he will need to definitely be seen in July for refill of medication and 6 month follow up.

## 2023-03-26 NOTE — Telephone Encounter (Signed)
Appointment is scheduled Called wife, no answer, left detailed message

## 2023-04-20 ENCOUNTER — Ambulatory Visit: Payer: Medicaid Other | Admitting: Family Medicine

## 2023-04-22 ENCOUNTER — Encounter: Payer: Self-pay | Admitting: Family Medicine

## 2023-04-28 ENCOUNTER — Telehealth: Payer: Self-pay | Admitting: Family Medicine

## 2023-04-28 DIAGNOSIS — I10 Essential (primary) hypertension: Secondary | ICD-10-CM

## 2023-04-28 MED ORDER — LISINOPRIL 10 MG PO TABS: 10.0000 mg | ORAL_TABLET | Freq: Every day | ORAL | 0 refills | Status: DC

## 2023-04-28 NOTE — Telephone Encounter (Signed)
  Prescription Request  04/28/2023  Is this a "Controlled Substance" medicine? NO  Have you seen your PCP in the last 2 weeks? [facility] ON 05/03/23 pt is out of this med  If YES, route message to pool  -  If NO, patient needs to be scheduled for appointment.  What is the name of the medication or equipment? lisinopril (ZESTRIL) 10 MG tablet  Have you contacted your pharmacy to request a refill? yes   Which pharmacy would you like this sent to? [facility]    Patient notified that their request is being sent to the clinical staff for review and that they should receive a response within 2 business days.

## 2023-04-28 NOTE — Telephone Encounter (Signed)
Rx sent left detailed message 

## 2023-05-06 ENCOUNTER — Ambulatory Visit: Payer: MEDICAID | Admitting: Family Medicine

## 2023-05-11 ENCOUNTER — Encounter: Payer: Self-pay | Admitting: Family Medicine

## 2023-05-11 ENCOUNTER — Ambulatory Visit (INDEPENDENT_AMBULATORY_CARE_PROVIDER_SITE_OTHER): Payer: MEDICAID | Admitting: Family Medicine

## 2023-05-11 VITALS — BP 123/79 | HR 63 | Temp 97.7°F | Resp 20 | Ht 72.0 in | Wt 135.0 lb

## 2023-05-11 DIAGNOSIS — I10 Essential (primary) hypertension: Secondary | ICD-10-CM

## 2023-05-11 DIAGNOSIS — M720 Palmar fascial fibromatosis [Dupuytren]: Secondary | ICD-10-CM | POA: Diagnosis not present

## 2023-05-11 DIAGNOSIS — F339 Major depressive disorder, recurrent, unspecified: Secondary | ICD-10-CM | POA: Diagnosis not present

## 2023-05-11 DIAGNOSIS — F1090 Alcohol use, unspecified, uncomplicated: Secondary | ICD-10-CM

## 2023-05-11 DIAGNOSIS — F411 Generalized anxiety disorder: Secondary | ICD-10-CM | POA: Diagnosis not present

## 2023-05-11 MED ORDER — METHYLPREDNISOLONE ACETATE 40 MG/ML IJ SUSP
20.0000 mg | Freq: Once | INTRAMUSCULAR | Status: AC
Start: 2023-05-11 — End: 2023-05-11
  Administered 2023-05-11: 20 mg via INTRAMUSCULAR

## 2023-05-11 NOTE — Progress Notes (Signed)
Subjective:  Patient ID: Trevor Sosa, male    DOB: [birth year], 42 y.o.   MRN: [id number]  Patient Care Team: [name], FNP as PCP - General (Family Medicine)   Chief Complaint:  Medical Management of Chronic Issues   HPI: Trevor Sosa is a 42 y.o. male presenting on 05/11/2023 for Medical Management of Chronic Issues   1. Bilateral hand pain Pt reports ongoing pain and drawing of palms of bilateral hands. States this has been there for years but has worsened over the last few months. He has not tried anything for the symptom management.   2. Primary hypertension Complaint with meds - Yes Current Medications - lisinopril Checking BP at home - no Exercising Regularly - No Watching Salt intake - No Pertinent ROS:  Headache - No Fatigue - No Visual Disturbances - No Chest pain - No Dyspnea - No Palpitations - No LE edema - No They report good compliance with medications and can restate their regimen by memory. No medication side effects.  BP Readings from Last 3 Encounters:  05/11/23 123/79  03/07/23 124/80  10/20/22 (!) 148/94   3. Depression, recurrent (HCC) 4. GAD (generalized anxiety disorder) States he does not wish to start any medications at this time. He denies SI or HI.     05/11/2023   11:33 AM 10/20/2022    3:32 PM 01/15/2022   11:46 AM  GAD 7 : Generalized Anxiety Score  Nervous, Anxious, on Edge 0 0 1  Control/stop worrying 0 0 1  Worry too much - different things 0 0 1  Trouble relaxing 0 0 1  Restless 0 0 1  Easily annoyed or irritable 0 0 3  Afraid - awful might happen 0 0 3  Total GAD 7 Score 0 0 11  Anxiety Difficulty  Not difficult at all        05/11/2023   11:33 AM 10/20/2022    3:32 PM 01/15/2022   11:45 AM  Depression screen PHQ 2/9  Decreased Interest 0 3 2  Down, Depressed, Hopeless 0 0 2  PHQ - 2 Score 0 3 4  Altered sleeping 0 0 0  Tired, decreased energy 0 0 1  Change in appetite 0 0 0  Feeling bad or failure about  yourself  0 0 2  Trouble concentrating 0 0 2  Moving slowly or fidgety/restless 0 0 2  Suicidal thoughts 0 0 0  PHQ-9 Score 0 3 11  Difficult doing work/chores  Not difficult at all Somewhat difficult    5. Heavy alcohol consumption States he has cut back on his alcohol use but continues to drink daily.      Relevant past medical, surgical, family, and social history reviewed and updated as indicated.  Allergies and medications reviewed and updated. Data reviewed: Chart in Epic.   Past Medical History:  Diagnosis Date   Allergy to alpha-gal    No pertinent past medical history     Past Surgical History:  Procedure Laterality Date   CYSTECTOMY     Face, Chest, L knee      Social History   Socioeconomic History   Marital status: Married    Spouse name: Not on file   Number of children: Not on file   Years of education: Not on file   Highest education level: Not on file  Occupational History   Not on file  Tobacco Use   Smoking status: Every Day  Current packs/day: 1.00    Average packs/day: 1 pack/day for 21.0 years (21.0 ttl pk-yrs)    Types: Cigarettes   Smokeless tobacco: Never  Vaping Use   Vaping status: Never Used  Substance and Sexual Activity   Alcohol use: Yes    Alcohol/week: 48.0 standard drinks of alcohol    Types: 48 Cans of beer per week    Comment: occ. use   Drug use: No   Sexual activity: Yes    Birth control/protection: None  Other Topics Concern   Not on file  Social History Narrative   Not on file   Social Determinants of Health   Financial Resource Strain: Not on file  Food Insecurity: Not on file  Transportation Needs: Not on file  Physical Activity: Not on file  Stress: Not on file  Social Connections: Not on file  Intimate Partner Violence: Not on file    Outpatient Encounter Medications as of 05/11/2023  Medication Sig   lisinopril (ZESTRIL) 10 MG tablet Take 1 tablet (10 mg total) by mouth daily. (NEEDS TO BE SEEN  BEFORE NEXT REFILL)   [EXPIRED] methylPREDNISolone acetate (DEPO-MEDROL) injection 20 mg    [EXPIRED] methylPREDNISolone acetate (DEPO-MEDROL) injection 20 mg    No facility-administered encounter medications on file as of 05/11/2023.    Allergies  Allergen Reactions   Alpha-Gal     Review of Systems  Constitutional:  Negative for activity change, appetite change, chills, diaphoresis, fatigue, fever and unexpected weight change.  HENT: Negative.    Eyes: Negative.  Negative for photophobia and visual disturbance.  Respiratory:  Negative for cough, chest tightness and shortness of breath.   Cardiovascular:  Negative for chest pain, palpitations and leg swelling.  Gastrointestinal:  Negative for abdominal pain, blood in stool, constipation, diarrhea, nausea and vomiting.  Endocrine: Negative.  Negative for cold intolerance, heat intolerance, polydipsia, polyphagia and polyuria.  Genitourinary:  Negative for dysuria, frequency and urgency.  Musculoskeletal:  Positive for arthralgias (bilateral hands). Negative for myalgias.  Skin: Negative.   Allergic/Immunologic: Negative.   Neurological:  Negative for dizziness, tremors, seizures, syncope, facial asymmetry, speech difficulty, weakness, light-headedness, numbness and headaches.  Hematological: Negative.   Psychiatric/Behavioral:  Negative for confusion, hallucinations, sleep disturbance and suicidal ideas.   All other systems reviewed and are negative.       Objective:  BP 123/79   Pulse 63   Temp 97.7 F (36.5 C) (Oral)   Resp 20   Ht 6' (1.829 m)   Wt 135 lb (61.2 kg)   SpO2 100%   BMI 18.31 kg/m    Wt Readings from Last 3 Encounters:  05/11/23 135 lb (61.2 kg)  03/07/23 148 lb (67.1 kg)  10/20/22 143 lb 12.8 oz (65.2 kg)    Physical Exam Vitals and nursing note reviewed.  Constitutional:      General: He is not in acute distress.    Appearance: Normal appearance. He is well-developed, well-groomed and normal  weight. He is not ill-appearing, toxic-appearing or diaphoretic.  HENT:     Head: Normocephalic and atraumatic.     Jaw: There is normal jaw occlusion.     Right Ear: Hearing normal.     Left Ear: Hearing normal.     Nose: Nose normal.     Mouth/Throat:     Lips: Pink.     Mouth: Mucous membranes are moist.     Dentition: Abnormal dentition. Dental caries present.     Pharynx: Oropharynx is clear. Uvula midline.  Eyes:     General: Lids are normal.     Extraocular Movements: Extraocular movements intact.     Conjunctiva/sclera: Conjunctivae normal.     Pupils: Pupils are equal, round, and reactive to light.  Neck:     Thyroid: No thyroid mass, thyromegaly or thyroid tenderness.     Vascular: No JVD.     Trachea: Trachea and phonation normal.  Cardiovascular:     Rate and Rhythm: Normal rate and regular rhythm.     Chest Wall: PMI is not displaced.     Pulses: Normal pulses.     Heart sounds: Normal heart sounds. No murmur heard.    No friction rub. No gallop.  Pulmonary:     Effort: Pulmonary effort is normal. No respiratory distress.     Breath sounds: Normal breath sounds. No wheezing.  Abdominal:     General: Bowel sounds are normal. There is no distension or abdominal bruit.     Palpations: Abdomen is soft. There is no hepatomegaly or splenomegaly.     Tenderness: There is no abdominal tenderness. There is no right CVA tenderness or left CVA tenderness.     Hernia: No hernia is present.  Musculoskeletal:     Right wrist: Normal.     Left wrist: Normal.     Right hand: Tenderness present. Decreased range of motion.     Left hand: Tenderness present. Decreased range of motion.     Cervical back: Normal range of motion and neck supple.     Right lower leg: No edema.     Left lower leg: No edema.     Comments: Left and right hand: thickening and nodules of palmar fascia -  ulnar side  Skin:    General: Skin is warm and dry.     Capillary Refill: Capillary refill takes  less than 2 seconds.     Coloration: Skin is not cyanotic, jaundiced or pale.     Findings: No rash.  Neurological:     General: No focal deficit present.     Mental Status: He is alert and oriented to person, place, and time.     Sensory: Sensation is intact.     Motor: Motor function is intact.     Coordination: Coordination is intact.     Gait: Gait is intact.     Deep Tendon Reflexes: Reflexes are normal and symmetric.  Psychiatric:        Attention and Perception: Attention and perception normal.        Mood and Affect: Mood and affect normal.        Speech: Speech normal.        Behavior: Behavior normal. Behavior is cooperative.        Thought Content: Thought content normal.        Cognition and Memory: Cognition and memory normal.        Judgment: Judgment normal.     Results for orders placed or performed in visit on 10/20/22  CBC with Differential/Platelet  Result Value Ref Range   WBC 7.6 3.4 - 10.8 x10E3/uL   RBC 4.89 4.14 - 5.80 x10E6/uL   Hemoglobin 15.3 13.0 - 17.7 g/dL   Hematocrit 56.2 13.0 - 51.0 %   MCV 95 79 - 97 fL   MCH 31.3 26.6 - 33.0 pg   MCHC 33.0 31.5 - 35.7 g/dL   RDW 86.5 78.4 - 69.6 %   Platelets 292 150 - 450 x10E3/uL   Neutrophils 63 Not  Estab. %   Lymphs 27 Not Estab. %   Monocytes 10 Not Estab. %   Eos 0 Not Estab. %   Basos 0 Not Estab. %   Neutrophils Absolute 4.7 1.4 - 7.0 x10E3/uL   Lymphocytes Absolute 2.1 0.7 - 3.1 x10E3/uL   Monocytes Absolute 0.8 0.1 - 0.9 x10E3/uL   EOS (ABSOLUTE) 0.0 0.0 - 0.4 x10E3/uL   Basophils Absolute 0.0 0.0 - 0.2 x10E3/uL   Immature Granulocytes 0 Not Estab. %   Immature Grans (Abs) 0.0 0.0 - 0.1 x10E3/uL  CMP14+EGFR  Result Value Ref Range   Glucose 94 70 - 99 mg/dL   BUN 7 6 - 24 mg/dL   Creatinine, Ser 9.52 0.76 - 1.27 mg/dL   eGFR 841 >32 GM/WNU/2.72   BUN/Creatinine Ratio 8 (L) 9 - 20   Sodium 141 134 - 144 mmol/L   Potassium 4.3 3.5 - 5.2 mmol/L   Chloride 101 96 - 106 mmol/L   CO2 24 20  - 29 mmol/L   Calcium 9.9 8.7 - 10.2 mg/dL   Total Protein 7.3 6.0 - 8.5 g/dL   Albumin 5.0 4.1 - 5.1 g/dL   Globulin, Total 2.3 1.5 - 4.5 g/dL   Albumin/Globulin Ratio 2.2 1.2 - 2.2   Bilirubin Total 0.6 0.0 - 1.2 mg/dL   Alkaline Phosphatase 92 44 - 121 IU/L   AST 15 0 - 40 IU/L   ALT 12 0 - 44 IU/L  Lipid panel  Result Value Ref Range   Cholesterol, Total 198 100 - 199 mg/dL   Triglycerides 77 0 - 149 mg/dL   HDL 68 >53 mg/dL   VLDL Cholesterol Cal 14 5 - 40 mg/dL   LDL Chol Calc (NIH) 664 (H) 0 - 99 mg/dL   Chol/HDL Ratio 2.9 0.0 - 5.0 ratio  Thyroid Panel With TSH  Result Value Ref Range   TSH 1.360 0.450 - 4.500 uIU/mL   T4, Total 5.0 4.5 - 12.0 ug/dL   T3 Uptake Ratio 30 24 - 39 %   Free Thyroxine Index 1.5 1.2 - 4.9     Hand/Upper Extremity Injection/Arthrocentesis: L ring finger for Dupuytren's contracture on 05/11/2023 2:08 PM Indications: joint swelling, tendon swelling and pain Details: 25 G needle, volar approach Medications: (20 mg depo-medrol with 2% lidocaine) Aspirate: 0 mL Outcome: tolerated well, no immediate complications Procedure, treatment alternatives, risks and benefits explained, specific risks discussed. Consent was given by the patient. Immediately prior to procedure a time out was called to verify the correct patient, procedure, equipment, support staff and site/side marked as required. Patient was prepped and draped in the usual sterile fashion.    Hand/Upper Extremity Injection/Arthrocentesis: R ring finger for Dupuytren's contracture on 05/11/2023 2:10 PM Indications: joint swelling and pain Details: 22 G needle, volar approach Medications: (20 mg depo-medrol, 2% lidocaine) Aspirate: 0 mL Outcome: tolerated well, no immediate complications Procedure, treatment alternatives, risks and benefits explained, specific risks discussed. Consent was given by the patient. Immediately prior to procedure a time out was called to verify the correct patient,  procedure, equipment, support staff and site/side marked as required. Patient was prepped and draped in the usual sterile fashion.       Pertinent labs & imaging results that were available during my care of the patient were reviewed by me and considered in my medical decision making.  Assessment & Plan:  Trevor Sosa was seen today for medical management of chronic issues.  Diagnoses and all orders for this visit:  Primary  hypertension BP well controlled. Changes were not made in regimen today. Goal BP is 130/80. Pt aware to report any persistent high or low readings. DASH diet and exercise encouraged. Exercise at least 150 minutes per week and increase as tolerated. Goal BMI > 25. Stress management encouraged. Avoid nicotine and tobacco product use. Avoid excessive alcohol and NSAID's. Avoid more than 2000 mg of sodium daily. Medications as prescribed. Follow up as scheduled.  -     CBC with Differential/Platelet -     CMP14+EGFR -     Lipid panel -     Thyroid Panel With TSH  Dupuytren's contracture of both hands Bilateral injections today. Referral to hand surgery placed. Symptomatic care discussed in detail.  -     Ambulatory referral to Hand Surgery -     methylPREDNISolone acetate (DEPO-MEDROL) injection 20 mg -     methylPREDNISolone acetate (DEPO-MEDROL) injection 20 mg -     Hand/Upper Extremity Injection/Arthrocentesis: L ring finger -     Hand/Upper Extremity Injection/Arthrocentesis: R ring finger  Depression, recurrent (HCC) GAD (generalized anxiety disorder) Declines medications at this time. States he is doing well.  -     Thyroid Panel With TSH  Heavy alcohol consumption Will repeat labs today. Cessation emphasized.  -     CBC with Differential/Platelet -     CMP14+EGFR -     Thyroid Panel With TSH -     Vitamin B12 -     VITAMIN D 25 Hydroxy (Vit-D Deficiency, Fractures) -     Folate -     Vitamin B1       Continue all other maintenance medications.  Follow  up plan: No follow-ups on file.   Continue healthy lifestyle choices, including diet (rich in fruits, vegetables, and lean proteins, and low in salt and simple carbohydrates) and exercise (at least 30 minutes of moderate physical activity daily).  Educational handout given for dupuytren's contracture   The above assessment and management plan was discussed with the patient. The patient verbalized understanding of and has agreed to the management plan. Patient is aware to call the clinic if they develop any new symptoms or if symptoms persist or worsen. Patient is aware when to return to the clinic for a follow-up visit. Patient educated on when it is appropriate to go to the emergency department.   Kari Baars, FNP-C [facility] [phone]

## 2023-05-15 LAB — FOLATE: Folate: 13.4 ng/mL

## 2023-06-07 ENCOUNTER — Ambulatory Visit: Payer: Medicaid Other | Admitting: Orthopedic Surgery

## 2023-11-03 ENCOUNTER — Other Ambulatory Visit: Payer: Self-pay | Admitting: Family Medicine

## 2023-11-03 DIAGNOSIS — I10 Essential (primary) hypertension: Secondary | ICD-10-CM

## 2024-01-13 ENCOUNTER — Other Ambulatory Visit: Payer: Self-pay | Admitting: Family Medicine

## 2024-01-13 DIAGNOSIS — I10 Essential (primary) hypertension: Secondary | ICD-10-CM

## 2024-01-17 NOTE — Telephone Encounter (Signed)
 30 days was given 12/20/23 - NTBS for refills

## 2024-01-18 ENCOUNTER — Encounter: Payer: Self-pay | Admitting: Family Medicine

## 2024-01-18 NOTE — Telephone Encounter (Signed)
 Letter sent.

## 2024-01-27 ENCOUNTER — Ambulatory Visit: Payer: MEDICAID | Admitting: Family Medicine

## 2024-02-03 ENCOUNTER — Encounter: Payer: Self-pay | Admitting: Family Medicine

## 2024-02-03 ENCOUNTER — Ambulatory Visit: Payer: MEDICAID | Admitting: Family Medicine

## 2024-02-03 VITALS — BP 120/73 | HR 79 | Temp 98.0°F | Ht 73.0 in | Wt 133.2 lb

## 2024-02-03 DIAGNOSIS — F1090 Alcohol use, unspecified, uncomplicated: Secondary | ICD-10-CM

## 2024-02-03 DIAGNOSIS — I10 Essential (primary) hypertension: Secondary | ICD-10-CM | POA: Diagnosis not present

## 2024-02-03 DIAGNOSIS — M720 Palmar fascial fibromatosis [Dupuytren]: Secondary | ICD-10-CM

## 2024-02-03 MED ORDER — LISINOPRIL 10 MG PO TABS: 10.0000 mg | ORAL_TABLET | Freq: Every day | ORAL | 3 refills | Status: AC

## 2024-02-03 NOTE — Patient Instructions (Signed)
 Goal BP:  For patients younger than 60: Goal BP < 140/90. For patients 60 and older: Goal BP < 150/90. For patients with diabetes: Goal BP < 140/90.  Take your medications faithfully as prescribed. Maintain a healthy weight. Get at least 150 minutes of aerobic exercise per week. Minimize salt intake, less than 2000 mg per day. Minimize alcohol intake.  DASH Eating Plan DASH stands for "Dietary Approaches to Stop Hypertension." The DASH eating plan is a healthy eating plan that has been shown to reduce high blood pressure (hypertension). Additional health benefits may include reducing the risk of type 2 diabetes mellitus, heart disease, and stroke. The DASH eating plan may also help with weight loss.  WHAT DO I NEED TO KNOW ABOUT THE DASH EATING PLAN? For the DASH eating plan, you will follow these general guidelines: Choose foods with a percent daily value for sodium of less than 5% (as listed on the food label). Use salt-free seasonings or herbs instead of table salt or sea salt. Check with your health care provider or pharmacist before using salt substitutes. Eat lower-sodium products, often labeled as "lower sodium" or "no salt added." Eat fresh foods. Eat more vegetables, fruits, and low-fat dairy products. Choose whole grains. Look for the word "whole" as the first word in the ingredient list. Choose fish and skinless chicken or [place] more often than red meat. Limit fish, poultry, and meat to 6 oz (170 g) each day. Limit sweets, desserts, sugars, and sugary drinks. Choose heart-healthy fats. Limit cheese to 1 oz (28 g) per day. Eat more home-cooked food and less restaurant, buffet, and fast food. Limit fried foods. Cook foods using methods other than frying. Limit canned vegetables. If you do use them, rinse them well to decrease the sodium. When eating at a restaurant, ask that your food be prepared with less salt, or no salt if possible.  WHAT FOODS CAN I EAT? Seek help from  a dietitian for individual calorie needs.  Grains Whole grain or whole wheat bread. Brown rice. Whole grain or whole wheat pasta. Quinoa, bulgur, and whole grain cereals. Low-sodium cereals. Corn or whole wheat flour tortillas. Whole grain cornbread. Whole grain crackers. Low-sodium crackers.  Vegetables Fresh or frozen vegetables (raw, steamed, roasted, or grilled). Low-sodium or reduced-sodium tomato and vegetable juices. Low-sodium or reduced-sodium tomato sauce and paste. Low-sodium or reduced-sodium canned vegetables.   Fruits All fresh, canned (in natural juice), or frozen fruits.  Meat and Other Protein Products Ground beef (85% or leaner), grass-fed beef, or beef trimmed of fat. Skinless chicken or [place]. Ground chicken or [place]. Pork trimmed of fat. All fish and seafood. Eggs. Dried beans, peas, or lentils. Unsalted nuts and seeds. Unsalted canned beans.  Dairy Low-fat dairy products, such as skim or 1% milk, 2% or reduced-fat cheeses, low-fat ricotta or cottage cheese, or plain low-fat yogurt. Low-sodium or reduced-sodium cheeses.  Fats and Oils Tub margarines without trans fats. Light or reduced-fat mayonnaise and salad dressings (reduced sodium). Avocado. Safflower, olive, or canola oils. Natural peanut or almond butter.  Other Unsalted popcorn and pretzels. The items listed above may not be a complete list of recommended foods or beverages. Contact your dietitian for more options.  WHAT FOODS ARE NOT RECOMMENDED?  Grains White bread. White pasta. White rice. Refined cornbread. Bagels and croissants. Crackers that contain trans fat.  Vegetables Creamed or fried vegetables. Vegetables in a cheese sauce. Regular canned vegetables. Regular canned tomato sauce and paste. Regular tomato and vegetable juices.  Fruits Dried fruits. Canned fruit in light or heavy syrup. Fruit juice.  Meat and Other Protein Products Fatty cuts of meat. Ribs, chicken wings, bacon, sausage,  bologna, salami, chitterlings, fatback, hot dogs, bratwurst, and packaged luncheon meats. Salted nuts and seeds. Canned beans with salt.  Dairy Whole or 2% milk, cream, half-and-half, and cream cheese. Whole-fat or sweetened yogurt. Full-fat cheeses or blue cheese. Nondairy creamers and whipped toppings. Processed cheese, cheese spreads, or cheese curds.  Condiments Onion and garlic salt, seasoned salt, table salt, and sea salt. Canned and packaged gravies. Worcestershire sauce. Tartar sauce. Barbecue sauce. Teriyaki sauce. Soy sauce, including reduced sodium. Steak sauce. Fish sauce. Oyster sauce. Cocktail sauce. Horseradish. Ketchup and mustard. Meat flavorings and tenderizers. Bouillon cubes. Hot sauce. Tabasco sauce. Marinades. Taco seasonings. Relishes.  Fats and Oils Butter, stick margarine, lard, shortening, ghee, and bacon fat. Coconut, palm kernel, or palm oils. Regular salad dressings.  Other Pickles and olives. Salted popcorn and pretzels.  The items listed above may not be a complete list of foods and beverages to avoid. Contact your dietitian for more information.  WHERE CAN I FIND MORE INFORMATION? National [employer], [employer], and [employer] Institute: [email or web address] Document Released: 09/03/2011 Document Revised: 01/29/2014 Document Reviewed: 07/19/2013 [facility] Patient Information 2015 [place], [place]. This information is not intended to replace advice given to you by your health care provider. Make sure you discuss any questions you have with your health care provider.   I think that you would greatly benefit from seeing a nutritionist.  If you are interested, please call Dr. Gerilyn Pilgrim at [phone] to schedule an appointment.

## 2024-02-03 NOTE — Progress Notes (Signed)
 Subjective:  Patient ID: Trevor Sosa, male    DOB: 06-26-1981, 43 y.o.   MRN: [id number]  Patient Care Team: Galvin Jules, FNP as PCP - General (Family Medicine)   Chief Complaint:  Hypertension   HPI: Trevor Sosa is a 43 y.o. male presenting on 02/03/2024 for Hypertension  Trevor Sosa is a 43 year old male with hypertension who presents for blood pressure management.  He is currently taking lisinopril  for hypertension without side effects such as cough or fatigue. He is not on any other antihypertensive medications and requires refills for his current prescription. No changes in urine output, chest pain, leg swelling, or shortness of breath.  He continues to consume alcohol at the same or sometimes reduced levels. Previous lab tests indicated low vitamin D , B12, and thiamine levels, which are common in individuals who consume alcohol. He reports no significant side effects from drinking, such as problems with work, and only occasionally wants to stop drinking. He is not taking vitamin D  or B12 supplements, despite previous low levels associated with alcohol consumption.  Regarding mental health, he denies current feelings of anxiety or depression, indicating that these issues are well-managed.  He experiences issues with his hands that make it difficult to hold objects at work. He has considered seeing an orthopedic specialist for this problem.  No changes in vision and last visited the eye doctor last year.         02/03/2024    2:10 PM 05/11/2023   11:33 AM 10/20/2022    3:32 PM 01/15/2022   11:46 AM  GAD 7 : Generalized Anxiety Score  Nervous, Anxious, on Edge 0 0 0 1  Control/stop worrying 0 0 0 1  Worry too much - different things 0 0 0 1  Trouble relaxing 0 0 0 1  Restless 0 0 0 1  Easily annoyed or irritable 0 0 0 3  Afraid - awful might happen 0 0 0 3  Total GAD 7 Score 0 0 0 11  Anxiety Difficulty Not difficult at all  Not difficult at all        02/03/2024     2:10 PM 05/11/2023   11:33 AM 10/20/2022    3:32 PM 01/15/2022   11:45 AM  Depression screen PHQ 2/9  Decreased Interest 0 0 3 2  Down, Depressed, Hopeless 0 0 0 2  PHQ - 2 Score 0 0 3 4  Altered sleeping 0 0 0 0  Tired, decreased energy 0 0 0 1  Change in appetite 0 0 0 0  Feeling bad or failure about yourself  0 0 0 2  Trouble concentrating 0 0 0 2  Moving slowly or fidgety/restless 0 0 0 2  Suicidal thoughts 0 0 0 0  PHQ-9 Score 0 0 3 11  Difficult doing work/chores Not difficult at all  Not difficult at all Somewhat difficult       Relevant past medical, surgical, family, and social history reviewed and updated as indicated.  Allergies and medications reviewed and updated. Data reviewed: Chart in Epic.   Past Medical History:  Diagnosis Date   Allergy to alpha-gal    No pertinent past medical history     Past Surgical History:  Procedure Laterality Date   CYSTECTOMY     Face, Chest, L knee      Social History   Socioeconomic History   Marital status: Married    Spouse name: Not on file  Number of children: Not on file   Years of education: Not on file   Highest education level: Not on file  Occupational History   Not on file  Tobacco Use   Smoking status: Every Day    Current packs/day: 1.00    Average packs/day: 1 pack/day for 21.0 years (21.0 ttl pk-yrs)    Types: Cigarettes   Smokeless tobacco: Never  Vaping Use   Vaping status: Never Used  Substance and Sexual Activity   Alcohol use: Yes    Alcohol/week: 48.0 standard drinks of alcohol    Types: 48 Cans of beer per week    Comment: occ. use   Drug use: No   Sexual activity: Yes    Birth control/protection: None  Other Topics Concern   Not on file  Social History Narrative   Not on file   Social Drivers of Health   Financial Resource Strain: Not on file  Food Insecurity: Not on file  Transportation Needs: Not on file  Physical Activity: Not on file  Stress: Not on file  Social  Connections: Not on file  Intimate Partner Violence: Not on file    Outpatient Encounter Medications as of 02/03/2024  Medication Sig   [DISCONTINUED] lisinopril  (ZESTRIL ) 10 MG tablet Take 1 tablet (10 mg total) by mouth daily. **NEEDS TO BE SEEN BEFORE NEXT REFILL**   lisinopril  (ZESTRIL ) 10 MG tablet Take 1 tablet (10 mg total) by mouth daily.   No facility-administered encounter medications on file as of 02/03/2024.    Allergies  Allergen Reactions   Alpha-Gal     Pertinent ROS per HPI, otherwise unremarkable      Objective:  BP 120/73   Pulse 79   Temp 98 F (36.7 C)   Ht 6\' 1"  (1.854 m)   Wt 133 lb 3.2 oz (60.4 kg)   SpO2 100%   BMI 17.57 kg/m    Wt Readings from Last 3 Encounters:  02/03/24 133 lb 3.2 oz (60.4 kg)  05/11/23 135 lb (61.2 kg)  03/07/23 148 lb (67.1 kg)    Physical Exam Vitals and nursing note reviewed.  Constitutional:      General: He is not in acute distress.    Appearance: Normal appearance. He is normal weight. He is not ill-appearing, toxic-appearing or diaphoretic.  HENT:     Head: Normocephalic and atraumatic.     Mouth/Throat:     Mouth: Mucous membranes are moist.     Dentition: Abnormal dentition. Dental caries present.  Eyes:     Conjunctiva/sclera: Conjunctivae normal.     Pupils: Pupils are equal, round, and reactive to light.  Cardiovascular:     Rate and Rhythm: Normal rate and regular rhythm.     Heart sounds: Normal heart sounds.  Pulmonary:     Effort: Pulmonary effort is normal.     Breath sounds: Normal breath sounds.  Musculoskeletal:     Right wrist: Normal.     Left wrist: Normal.     Right hand: Deformity present. Decreased range of motion.     Left hand: Deformity present. Decreased range of motion.     Cervical back: Neck supple.     Right lower leg: No edema.     Left lower leg: No edema.     Comments: Left and right hand: thickening and nodules of palmar fascia -  ulnar side   Skin:    General: Skin is  warm and dry.     Capillary Refill: Capillary refill  takes less than 2 seconds.  Neurological:     General: No focal deficit present.     Mental Status: He is alert and oriented to person, place, and time.  Psychiatric:        Mood and Affect: Mood normal.        Behavior: Behavior normal.        Thought Content: Thought content normal.        Judgment: Judgment normal.     Results for orders placed or performed in visit on 05/11/23  CBC with Differential/Platelet   Collection Time: 05/11/23 11:59 AM  Result Value Ref Range   WBC 5.4 3.4 - 10.8 x10E3/uL   RBC 4.53 4.14 - 5.80 x10E6/uL   Hemoglobin 14.4 13.0 - 17.7 g/dL   Hematocrit 40.9 81.1 - 51.0 %   MCV 95 79 - 97 fL   MCH 31.8 26.6 - 33.0 pg   MCHC 33.6 31.5 - 35.7 g/dL   RDW 91.4 78.2 - 95.6 %   Platelets 314 150 - 450 x10E3/uL   Neutrophils 56 Not Estab. %   Lymphs 29 Not Estab. %   Monocytes 13 Not Estab. %   Eos 1 Not Estab. %   Basos 1 Not Estab. %   Neutrophils Absolute 3.1 1.4 - 7.0 x10E3/uL   Lymphocytes Absolute 1.6 0.7 - 3.1 x10E3/uL   Monocytes Absolute 0.7 0.1 - 0.9 x10E3/uL   EOS (ABSOLUTE) 0.1 0.0 - 0.4 x10E3/uL   Basophils Absolute 0.0 0.0 - 0.2 x10E3/uL   Immature Granulocytes 0 Not Estab. %   Immature Grans (Abs) 0.0 0.0 - 0.1 x10E3/uL  CMP14+EGFR   Collection Time: 05/11/23 11:59 AM  Result Value Ref Range   Glucose 79 70 - 99 mg/dL   BUN 9 6 - 24 mg/dL   Creatinine, Ser 2.13 0.76 - 1.27 mg/dL   eGFR 086 >57 QI/ONG/2.95   BUN/Creatinine Ratio 11 9 - 20   Sodium 139 134 - 144 mmol/L   Potassium 5.2 3.5 - 5.2 mmol/L   Chloride 98 96 - 106 mmol/L   CO2 27 20 - 29 mmol/L   Calcium 9.8 8.7 - 10.2 mg/dL   Total Protein 7.3 6.0 - 8.5 g/dL   Albumin 4.8 4.1 - 5.1 g/dL   Globulin, Total 2.5 1.5 - 4.5 g/dL   Bilirubin Total 0.4 0.0 - 1.2 mg/dL   Alkaline Phosphatase 84 44 - 121 IU/L   AST 22 0 - 40 IU/L   ALT 17 0 - 44 IU/L  Lipid panel   Collection Time: 05/11/23 11:59 AM  Result Value Ref  Range   Cholesterol, Total 182 100 - 199 mg/dL   Triglycerides 51 0 - 149 mg/dL   HDL 68 >28 mg/dL   VLDL Cholesterol Cal 10 5 - 40 mg/dL   LDL Chol Calc (NIH) 413 (H) 0 - 99 mg/dL   Chol/HDL Ratio 2.7 0.0 - 5.0 ratio  Thyroid  Panel With TSH   Collection Time: 05/11/23 11:59 AM  Result Value Ref Range   TSH 1.620 0.450 - 4.500 uIU/mL   T4, Total 4.2 (L) 4.5 - 12.0 ug/dL   T3 Uptake Ratio 26 24 - 39 %   Free Thyroxine Index 1.1 (L) 1.2 - 4.9  Vitamin B12   Collection Time: 05/11/23 11:59 AM  Result Value Ref Range   Vitamin B-12 192 (L) 232 - 1,245 pg/mL  VITAMIN D  25 Hydroxy (Vit-D Deficiency, Fractures)   Collection Time: 05/11/23 11:59 AM  Result Value Ref  Range   Vit D, 25-Hydroxy 21.1 (L) 30.0 - 100.0 ng/mL  Folate   Collection Time: 05/11/23 11:59 AM  Result Value Ref Range   Folate 13.4 >3.0 ng/mL  Vitamin B1   Collection Time: 05/11/23 11:59 AM  Result Value Ref Range   Thiamine 116.3 66.5 - 200.0 nmol/L       Pertinent labs & imaging results that were available during my care of the patient were reviewed by me and considered in my medical decision making.  Assessment & Plan:  Dayveon was seen today for hypertension.  Diagnoses and all orders for this visit:  Primary hypertension -     lisinopril  (ZESTRIL ) 10 MG tablet; Take 1 tablet (10 mg total) by mouth daily. -     CMP14+EGFR -     CBC with Differential/Platelet -     Thyroid  Panel With TSH  Heavy alcohol consumption -     Folate -     Vitamin B1 -     CMP14+EGFR -     CBC with Differential/Platelet -     VITAMIN D  25 Hydroxy (Vit-D Deficiency, Fractures) -     Vitamin B12 -     Thyroid  Panel With TSH  Dupuytren's contracture of both hands -     Ambulatory referral to Orthopedic Surgery     Assessment and Plan    Dupuytren's contracture Dupuytren's contracture impairs hand function, affecting his ability to hold objects at work. He is interested in orthopedic specialist management. - Refer to  orthopedic specialist for Dupuytren's contracture.  Hypertension Hypertension is managed with lisinopril  without side effects such as cough, congestion, altered urine output, chest pain, leg swelling, or dyspnea. - Send lisinopril  refills to [facility].  Alcohol use Continues alcohol use without significant side effects. Previous labs indicated low vitamin D , B12, and thiamine levels, likely due to alcohol use. He sometimes desires to cease drinking. - Order labs for thiamine, folate, vitamin B12, renal function, and hepatic function.           Continue all other maintenance medications.  Follow up plan: Return in about 3 months (around 05/05/2024) for HTN.   Continue healthy lifestyle choices, including diet (rich in fruits, vegetables, and lean proteins, and low in salt and simple carbohydrates) and exercise (at least 30 minutes of moderate physical activity daily).  Educational handout given for DASH diet, HTN  The above assessment and management plan was discussed with the patient. The patient verbalized understanding of and has agreed to the management plan. Patient is aware to call the clinic if they develop any new symptoms or if symptoms persist or worsen. Patient is aware when to return to the clinic for a follow-up visit. Patient educated on when it is appropriate to go to the emergency department.   Kattie Parrot, FNP-C [facility] [phone]

## 2024-02-07 LAB — CBC WITH DIFFERENTIAL/PLATELET
Basophils Absolute: 0 10*3/uL (ref 0.0–0.2)
Basos: 0 %
EOS (ABSOLUTE): 0.1 10*3/uL (ref 0.0–0.4)
Eos: 2 %
Hematocrit: 43.6 % (ref 37.5–51.0)
Hemoglobin: 14.8 g/dL (ref 13.0–17.7)
Immature Grans (Abs): 0 10*3/uL (ref 0.0–0.1)
Immature Granulocytes: 0 %
Lymphocytes Absolute: 1.6 10*3/uL (ref 0.7–3.1)
Lymphs: 28 %
MCH: 32.8 pg (ref 26.6–33.0)
MCHC: 33.9 g/dL (ref 31.5–35.7)
MCV: 97 fL (ref 79–97)
Monocytes Absolute: 0.6 10*3/uL (ref 0.1–0.9)
Monocytes: 11 %
Neutrophils Absolute: 3.4 10*3/uL (ref 1.4–7.0)
Neutrophils: 59 %
Platelets: 296 10*3/uL (ref 150–450)
RBC: 4.51 x10E6/uL (ref 4.14–5.80)
RDW: 12.2 % (ref 11.6–15.4)
WBC: 5.8 10*3/uL (ref 3.4–10.8)

## 2024-02-07 LAB — CMP14+EGFR
ALT: 26 IU/L (ref 0–44)
AST: 28 IU/L (ref 0–40)
Albumin: 4.8 g/dL (ref 4.1–5.1)
Alkaline Phosphatase: 99 IU/L (ref 44–121)
BUN/Creatinine Ratio: 12 (ref 9–20)
BUN: 10 mg/dL (ref 6–24)
Bilirubin Total: 0.5 mg/dL (ref 0.0–1.2)
CO2: 25 mmol/L (ref 20–29)
Calcium: 9.7 mg/dL (ref 8.7–10.2)
Chloride: 97 mmol/L (ref 96–106)
Creatinine, Ser: 0.81 mg/dL (ref 0.76–1.27)
Globulin, Total: 2.4 g/dL (ref 1.5–4.5)
Glucose: 94 mg/dL (ref 70–99)
Potassium: 4.8 mmol/L (ref 3.5–5.2)
Sodium: 135 mmol/L (ref 134–144)
Total Protein: 7.2 g/dL (ref 6.0–8.5)
eGFR: 111 mL/min/{1.73_m2} (ref >59)

## 2024-02-07 LAB — THYROID PANEL WITH TSH
Free Thyroxine Index: 1 — ABNORMAL LOW (ref 1.2–4.9)
T3 Uptake Ratio: 27 % (ref 24–39)
T4, Total: 3.8 ug/dL — ABNORMAL LOW (ref 4.5–12.0)
TSH: 0.817 u[IU]/mL (ref 0.450–4.500)

## 2024-02-07 LAB — FOLATE: Folate: 12.4 ng/mL (ref >3.0)

## 2024-02-07 LAB — VITAMIN B1: Thiamine: 94 nmol/L (ref 66.5–200.0)

## 2024-02-07 LAB — VITAMIN B12: Vitamin B-12: 204 pg/mL — ABNORMAL LOW (ref 232–1245)

## 2024-02-07 LAB — VITAMIN D 25 HYDROXY (VIT D DEFICIENCY, FRACTURES): Vit D, 25-Hydroxy: 22.5 ng/mL — ABNORMAL LOW (ref 30.0–100.0)

## 2024-03-28 ENCOUNTER — Encounter: Payer: Self-pay | Admitting: Orthopedic Surgery

## 2024-03-28 ENCOUNTER — Ambulatory Visit: Payer: MEDICAID | Admitting: Orthopedic Surgery

## 2024-03-28 VITALS — BP 133/77 | HR 79 | Ht 72.0 in | Wt 133.0 lb

## 2024-03-28 DIAGNOSIS — M720 Palmar fascial fibromatosis [Dupuytren]: Secondary | ICD-10-CM

## 2024-03-28 NOTE — Progress Notes (Signed)
 New Patient Visit  Assessment: Trevor Sosa is a 43 y.o. male with the following: 1. Dupuytren contracture of both hands  Plan: WOODY KRONBERG has Dupuytren contractures to bilateral hands.  Left is worse than right.  It is affecting his ability to complete his tasks at work.  He is unable to get both hands flat.  We discussed this condition in clinic today.  I have recommended referral to see Dr. Agarwala for further management.  Follow-up: Return for Referral to Dr. Arlinda.  Subjective:  Chief Complaint  Patient presents with   Hand Problem    Both hands  Dupuytren's contractures     History of Present Illness: Trevor Sosa is a 43 y.o. male who has been referred by Rock bruns, FNP for evaluation of bilateral hand pain.  He states has had progressively worsening nodules and pain in both hands.  Left is worse than right.  He is right-hand dominant.  It is affecting his ability to complete tasks at work.  No immediate family members have a similar condition.  He has apparently had a steroid injection in both hands, but this did not improve his symptoms.   Review of Systems: No fevers or chills No numbness or tingling No chest pain No shortness of breath No bowel or bladder dysfunction No GI distress No headaches   Medical History:  Past Medical History:  Diagnosis Date   Allergy to alpha-gal    No pertinent past medical history     Past Surgical History:  Procedure Laterality Date   CYSTECTOMY     Face, Chest, L knee      Family History  Problem Relation Age of Onset   Hyperlipidemia Mother    Diabetes Mother    Hypertension Mother    Hypertension Maternal Grandmother    Diabetes Maternal Grandmother    Social History   Tobacco Use   Smoking status: Every Day    Current packs/day: 1.00    Average packs/day: 1 pack/day for 21.0 years (21.0 ttl pk-yrs)    Types: Cigarettes   Smokeless tobacco: Never  Vaping Use   Vaping status: Never Used  Substance  Use Topics   Alcohol use: Yes    Alcohol/week: 48.0 standard drinks of alcohol    Types: 48 Cans of beer per week    Comment: occ. use   Drug use: No    Allergies  Allergen Reactions   Alpha-Gal     No outpatient medications have been marked as taking for the 03/28/24 encounter (Office Visit) with Onesimo Oneil LABOR, MD.    Objective: BP 133/77   Pulse 79   Ht 6' (1.829 m)   Wt 133 lb (60.3 kg)   BMI 18.04 kg/m   Physical Exam:  General: Alert and oriented. and No acute distress. Gait: Normal gait.  Bilateral hands with Dupuytren's cords to the ring finger.  Contracture of the left is worse than the right.  He does not have full extension of the ring fingers bilaterally.  Fingers warm and well-perfused.  Sensation intact to both hands.  IMAGING: No new imaging obtained today   New Medications:  No orders of the defined types were placed in this encounter.     Oneil LABOR Onesimo, MD  03/28/2024 1:26 PM

## 2024-03-28 NOTE — Addendum Note (Signed)
 Addended by: VICENTA EMMIE HERO on: 03/28/2024 01:30 PM   Modules accepted: Orders

## 2024-04-10 ENCOUNTER — Ambulatory Visit: Payer: Self-pay

## 2024-04-10 NOTE — Telephone Encounter (Signed)
 FYI Only or Action Required?: FYI only for provider.  Patient was last seen in primary care on 02/03/2024 by Severa Rock HERO, FNP.  Called Nurse Triage reporting Abdominal Pain.  Symptoms began yesterday.  Interventions attempted: Nothing.  Symptoms are: gradually worsening.  Triage Disposition: No disposition on file.  Patient/caregiver understands and will follow disposition?:     Copied from CRM [id number]. Topic: Clinical - Red Word Triage >> Apr 10, 2024  8:45 AM Laurier C wrote: Red Word that prompted transfer to Nurse Triage: Patient has celulitus  on his leg and complaining of pain ( wife thinks it may be his apendex started this yesterday woke up crunched over in pain.) Pain level from stomach pain 8 Reason for Disposition  Patient sounds very sick or weak to the triager  Answer Assessment - Initial Assessment Questions 1. LOCATION: Where does it hurt?      Doubled over in pain this am, on right side 2. RADIATION: Does the pain shoot anywhere else? (e.g., chest, back)     no 3. ONSET: When did the pain begin? (Minutes, hours or days ago)      yesterday 4. SUDDEN: Gradual or sudden onset?     suddenly 5. PATTERN Does the pain come and go, or is it constant?     When he moves it hurts 6. SEVERITY: How bad is the pain?  (e.g., Scale 1-10; mild, moderate, or severe)     moderate 7. RECURRENT SYMPTOM: Have you ever had this type of stomach pain before? If Yes, ask: When was the last time? and What happened that time?      no 8. CAUSE: What do you think is causing the stomach pain? (e.g., gallstones, recent abdominal surgery)     appendix 9. RELIEVING/AGGRAVATING FACTORS: What makes it better or worse? (e.g., antacids, bending or twisting motion, bowel movement)     Sitting down 10. OTHER SYMPTOMS: Do you have any other symptoms? (e.g., back pain, diarrhea, fever, urination pain, vomiting)       Denies.  Protocols used: Abdominal Pain - Male-A-AH

## 2024-04-10 NOTE — Telephone Encounter (Signed)
 Patient went to emergency room.

## 2024-04-11 ENCOUNTER — Encounter: Payer: Self-pay | Admitting: Family Medicine

## 2024-04-11 ENCOUNTER — Ambulatory Visit: Payer: MEDICAID | Admitting: Family Medicine

## 2024-04-12 ENCOUNTER — Ambulatory Visit: Payer: Self-pay

## 2024-04-12 NOTE — Telephone Encounter (Signed)
 FYI Only or Action Required?: FYI only for provider.  Patient was last seen in primary care on 02/03/2024 by Severa Rock HERO, FNP.  Called Nurse Triage reporting Abdominal Pain.  Symptoms began several days ago.  Interventions attempted: Rest, hydration, or home remedies.  Symptoms are: unchanged.  Triage Disposition: See Physician Within 24 Hours  Patient/caregiver understands and will follow disposition?: Yes     Copied from CRM [id number]. Topic: Clinical - Red Word Triage >> Apr 12, 2024  9:52 AM Turkey B wrote: [facility] that prompted transfer to Nurse Triage: pt and wife calling in, pt still has severe abdominal pain Reason for Disposition  [1] MODERATE pain (e.g., interferes with normal activities) AND [2] pain comes and goes (cramps) AND [3] present > 24 hours  (Exception: Pain with Vomiting or Diarrhea - see that Guideline.)  Answer Assessment - Initial Assessment Questions 1. LOCATION: Where does it hurt?      Abd pain 2. RADIATION: Does the pain shoot anywhere else? (e.g., chest, back)     To side 3. ONSET: When did the pain begin? (Minutes, hours or days ago)      3 days ago  5. PATTERN Does the pain come and go, or is it constant?     Comes and goes 6. SEVERITY: How bad is the pain?  (e.g., Scale 1-10; mild, moderate, or severe)     Same as before  8. CAUSE: What do you think is causing the stomach pain? (e.g., gallstones, recent abdominal surgery)     Kidney stone 9. RELIEVING/AGGRAVATING FACTORS: What makes it better or worse? (e.g., antacids, bending or twisting motion, bowel movement)     movement 10. OTHER SYMPTOMS: Do you have any other symptoms? (e.g., back pain, diarrhea, fever, urination pain, vomiting)       no  Protocols used: Abdominal Pain - Male-A-AH

## 2024-04-12 NOTE — Telephone Encounter (Signed)
 Apt scheduled.

## 2024-04-13 ENCOUNTER — Encounter: Payer: Self-pay | Admitting: Family Medicine

## 2024-04-13 ENCOUNTER — Ambulatory Visit: Payer: MEDICAID | Admitting: Family Medicine

## 2024-04-13 VITALS — BP 120/82 | HR 84 | Temp 98.2°F | Ht 72.0 in | Wt 133.4 lb

## 2024-04-13 DIAGNOSIS — R1011 Right upper quadrant pain: Secondary | ICD-10-CM | POA: Diagnosis not present

## 2024-04-13 DIAGNOSIS — E538 Deficiency of other specified B group vitamins: Secondary | ICD-10-CM | POA: Diagnosis not present

## 2024-04-13 NOTE — Progress Notes (Signed)
 Acute Office Visit  Subjective:     Patient ID: Trevor Sosa, male    DOB: 08/25/1981, 43 y.o.   MRN: [id number]  Chief Complaint  Patient presents with   Abdominal Pain    Abdominal Pain This is a new problem. The current episode started in the past 7 days. The problem occurs intermittently. The problem has been gradually improving. The pain is located in the RLQ. The pain is at a severity of 1/10. Quality: was sharp, now dull. The abdominal pain does not radiate. Pertinent negatives include no anorexia, constipation, diarrhea, dysuria, fever, flatus, frequency, hematochezia, hematuria, melena, nausea, vomiting or weight loss. The pain is aggravated by movement (twisting). The pain is relieved by Recumbency and being still. He has tried nothing for the symptoms. Prior diagnostic workup includes CT scan.   He was seen in there ER on 04/10/24. He had a CT of his abdomen with no acute findings. There was a 3 mm non-obstructing left nephrolithiasis. Sodium was 131, otherwise BMP and CBC were unremarkable.    He reports hx of vitamin b12 deficiency. He hasn't been taking a supplement.   Review of Systems  Constitutional:  Negative for fever and weight loss.  Gastrointestinal:  Positive for abdominal pain. Negative for anorexia, constipation, diarrhea, flatus, hematochezia, melena, nausea and vomiting.  Genitourinary:  Negative for dysuria, frequency and hematuria.        Objective:    BP 120/82   Pulse 84   Temp 98.2 F (36.8 C) (Temporal)   Ht 6' (1.829 m)   Wt 133 lb 6.4 oz (60.5 kg)   SpO2 98%   BMI 18.09 kg/m  Wt Readings from Last 3 Encounters:  04/13/24 133 lb 6.4 oz (60.5 kg)  03/28/24 133 lb (60.3 kg)  02/03/24 133 lb 3.2 oz (60.4 kg)      Physical Exam Vitals and nursing note reviewed.  Constitutional:      General: He is not in acute distress.    Appearance: He is underweight. He is not ill-appearing, toxic-appearing or diaphoretic.  Eyes:     General: No  scleral icterus. Cardiovascular:     Rate and Rhythm: Normal rate and regular rhythm.     Heart sounds: No murmur heard. Pulmonary:     Effort: Pulmonary effort is normal. No respiratory distress.     Breath sounds: Normal breath sounds. No wheezing, rhonchi or rales.  Abdominal:     General: Bowel sounds are normal. There is no distension.     Palpations: Abdomen is soft.     Tenderness: There is no abdominal tenderness. There is no right CVA tenderness, left CVA tenderness, guarding or rebound.  Musculoskeletal:     Right lower leg: No edema.     Left lower leg: No edema.  Skin:    General: Skin is warm.     Coloration: Skin is not jaundiced.  Neurological:     General: No focal deficit present.     Mental Status: He is alert and oriented to person, place, and time.  Psychiatric:        Mood and Affect: Mood normal.        Behavior: Behavior normal.     No results found for any visits on 04/13/24.      Assessment & Plan:   Trevor Sosa was seen today for abdominal pain.  Diagnoses and all orders for this visit:  RUQ abdominal pain Improving, mild. Reviewed CT with no acute findings in ER. Reviwed  CBC, BMP from ER and discharge summary. No other symptoms. Worse with movement, improve with rest. Discussed ? MSK as he had been doing a lot of lifting the week prior. Will recheck labs today. Return to office for new or worsening symptoms, or if symptoms persist.        -     CBC with Differential/Platelet -     CMP14+EGFR  B12 deficiency Not on supplement. Level pending.  -     Vitamin B12  Return if symptoms worsen or fail to improve.  The patient indicates understanding of these issues and agrees with the plan.  Annabella CHRISTELLA Search, FNP

## 2024-04-14 LAB — CMP14+EGFR
ALT: 14 IU/L (ref 0–44)
AST: 19 IU/L (ref 0–40)
Albumin: 4.6 g/dL (ref 4.1–5.1)
Alkaline Phosphatase: 90 IU/L (ref 44–121)
BUN/Creatinine Ratio: 12 (ref 9–20)
BUN: 10 mg/dL (ref 6–24)
Bilirubin Total: 0.4 mg/dL (ref 0.0–1.2)
CO2: 22 mmol/L (ref 20–29)
Calcium: 9.5 mg/dL (ref 8.7–10.2)
Chloride: 98 mmol/L (ref 96–106)
Creatinine, Ser: 0.83 mg/dL (ref 0.76–1.27)
Globulin, Total: 2.3 g/dL (ref 1.5–4.5)
Glucose: 87 mg/dL (ref 70–99)
Potassium: 4.7 mmol/L (ref 3.5–5.2)
Sodium: 137 mmol/L (ref 134–144)
Total Protein: 6.9 g/dL (ref 6.0–8.5)
eGFR: 111 mL/min/1.73 (ref >59)

## 2024-04-14 LAB — CBC WITH DIFFERENTIAL/PLATELET
Basophils Absolute: 0 x10E3/uL (ref 0.0–0.2)
Basos: 1 %
EOS (ABSOLUTE): 0 x10E3/uL (ref 0.0–0.4)
Eos: 1 %
Hematocrit: 41.9 % (ref 37.5–51.0)
Hemoglobin: 14.2 g/dL (ref 13.0–17.7)
Immature Grans (Abs): 0 x10E3/uL (ref 0.0–0.1)
Immature Granulocytes: 0 %
Lymphocytes Absolute: 1.8 x10E3/uL (ref 0.7–3.1)
Lymphs: 27 %
MCH: 33.2 pg — ABNORMAL HIGH (ref 26.6–33.0)
MCHC: 33.9 g/dL (ref 31.5–35.7)
MCV: 98 fL — ABNORMAL HIGH (ref 79–97)
Monocytes Absolute: 0.7 x10E3/uL (ref 0.1–0.9)
Monocytes: 11 %
Neutrophils Absolute: 4 x10E3/uL (ref 1.4–7.0)
Neutrophils: 60 %
Platelets: 324 x10E3/uL (ref 150–450)
RBC: 4.28 x10E6/uL (ref 4.14–5.80)
RDW: 12.1 % (ref 11.6–15.4)
WBC: 6.6 x10E3/uL (ref 3.4–10.8)

## 2024-04-14 LAB — VITAMIN B12: Vitamin B-12: 215 pg/mL — ABNORMAL LOW (ref 232–1245)

## 2024-04-18 ENCOUNTER — Ambulatory Visit: Payer: Self-pay | Admitting: Family Medicine

## 2024-04-18 DIAGNOSIS — E538 Deficiency of other specified B group vitamins: Secondary | ICD-10-CM

## 2024-05-16 ENCOUNTER — Encounter: Payer: Self-pay | Admitting: Orthopedic Surgery

## 2024-06-06 ENCOUNTER — Other Ambulatory Visit (INDEPENDENT_AMBULATORY_CARE_PROVIDER_SITE_OTHER): Payer: MEDICAID

## 2024-06-06 ENCOUNTER — Ambulatory Visit (INDEPENDENT_AMBULATORY_CARE_PROVIDER_SITE_OTHER): Payer: MEDICAID | Admitting: Orthopedic Surgery

## 2024-06-06 DIAGNOSIS — M24541 Contracture, right hand: Secondary | ICD-10-CM

## 2024-06-06 DIAGNOSIS — M24542 Contracture, left hand: Secondary | ICD-10-CM

## 2024-06-06 DIAGNOSIS — M720 Palmar fascial fibromatosis [Dupuytren]: Secondary | ICD-10-CM

## 2024-06-06 DIAGNOSIS — M24549 Contracture, unspecified hand: Secondary | ICD-10-CM

## 2024-06-06 NOTE — Progress Notes (Signed)
 Trevor Sosa - 43 y.o. male MRN [id number]  Date of birth: 1981/08/26  Office Visit Note: Visit Date: 06/06/2024 PCP: Severa Rock HERO, FNP Referred by: Severa Rock HERO, FNP  Subjective: No chief complaint on file.  HPI: Trevor Sosa is a pleasant 43 y.o. 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  He is here today for specific evaluation of the left ring finger contracture that has been progressive in nature over the past few years.  He works in [employer], feels that this is becoming more restrictive in terms of his range of motion and function.  Unsure of any specific family history.  Has not undergone any prior treatments.  He has been seen previously by my partner Dr. Onesimo, he has been referred to me today for specific hand surgical evaluation.  Pertinent ROS were reviewed with the patient and found to be negative unless otherwise specified above in HPI.   Visit Reason:Dupuytren disease of both hands, left ring finger contracture Duration of symptoms: 2 years Hand dominance: right Occupation:  Diabetic: No Smoking: Yes, a pack a day Heart/Lung History: hypertension Blood Thinners: none  Prior Testing/EMG: none Injections (Date): none Treatments: none Prior Surgery: none  Assessment & Plan: Visit Diagnoses:  1. Dupuytren contracture of both hands   2. Contracture of hand     Plan: Extensive discussion was had with the patient today regarding his bilateral hand Dupuytren's disease.  We discussed the underlying etiology and pathophysiology of this condition as well as all treatment modalities ranging from conservative to surgical.  We also discussed the recurrent nature of Dupuytren's disease.  From a conservative standpoint, we discussed ongoing observation, splinting, therapy and activity modification.  We also discussed the potential for collagenase injection as well as the appropriate protocol and  timeframe for recovery.  From a surgical standpoint, we discussed fasciectomy as well as all risks and benefits associated.  Risks and benefits of the procedure were discussed, risks including but not limited to infection, bleeding, scarring, stiffness, nerve injury, tendon injury, vascular injury,, recurrence of symptoms and need for subsequent operation.  We also discussed the appropriate postoperative protocol and timeframe for return to activities and function.  Patient expressed understanding.    Understanding the options, patient would like to move forward with Xiaflex injection to the left hand ring finger MP contracture which is appropriate.  Risks and benefits of the Xiaflex injection were discussed in detail as well as postprocedural splinting and occupational therapy.  We discussed the potential for skin tearing in particular as well.  Understanding the risk and benefits, patient would like to proceed.  Will contact them when the injection is ready to move forward with scheduling of both injection and subsequent manipulation.   Follow-up: No follow-ups on file.   Meds & Orders: No orders of the defined types were placed in this encounter.   Orders Placed This Encounter  Procedures   XR Hand Complete Left   XR Hand Complete Right     Procedures: No procedures performed      Clinical History: No specialty comments available.  He reports that he has been smoking cigarettes. He has a 21 pack-year smoking history. He has never used smokeless tobacco. No results for input(s): HGBA1C, LABURIC in the last 8760 hours.  Objective:   Vital Signs: There were no vitals taken for this visit.  Physical Exam  Gen: Well-appearing, in no acute distress; non-toxic  CV: Regular Rate. Well-perfused. Warm.  Resp: Breathing unlabored on room air; no wheezing. Psych: Fluid speech in conversation; appropriate affect; normal thought process  Ortho Exam Left hand: - Palpable cord in line with  the ring finger, MP contracture 40 degrees, not passively correctable, positive tabletop test  Right hand: - Palpable cord in line with the ring finger, MP contracture 5 degrees, slightly passively correctable  Imaging: XR Hand Complete Left Result Date: 06/06/2024 There is no evidence of fracture or dislocation. There is no evidence of arthropathy or other focal bone abnormality. Soft tissues are unremarkable.   XR Hand Complete Right Result Date: 06/06/2024 There is no evidence of fracture or dislocation. There is no evidence of arthropathy or other focal bone abnormality. Soft tissues are unremarkable.    Past Medical/Family/Surgical/Social History: Medications & Allergies reviewed per EMR, new medications updated. Patient Active Problem List   Diagnosis Date Noted   Dupuytren's contracture of both hands 05/11/2023   Depression, recurrent (HCC) 01/15/2022   GAD (generalized anxiety disorder) 01/15/2022   Primary hypertension 01/09/2021   Heavy alcohol consumption 01/09/2021   Tobacco use disorder 01/09/2021   Past Medical History:  Diagnosis Date   Allergy to alpha-gal    No pertinent past medical history    Family History  Problem Relation Age of Onset   Hyperlipidemia Mother    Diabetes Mother    Hypertension Mother    Hypertension Maternal Grandmother    Diabetes Maternal Grandmother    Past Surgical History:  Procedure Laterality Date   CYSTECTOMY     Face, Chest, L knee     Social History   Occupational History   Not on file  Tobacco Use   Smoking status: Every Day    Current packs/day: 1.00    Average packs/day: 1 pack/day for 21.0 years (21.0 ttl pk-yrs)    Types: Cigarettes   Smokeless tobacco: Never  Vaping Use   Vaping status: Never Used  Substance and Sexual Activity   Alcohol use: Yes    Alcohol/week: 48.0 standard drinks of alcohol    Types: 48 Cans of beer per week    Comment: occ. use   Drug use: No   Sexual activity: Yes    Birth  control/protection: None    Trevor Sosa Trevor) Fumiko Sosa, M.D. [facility], Hand Surgery

## 2024-06-26 ENCOUNTER — Other Ambulatory Visit: Payer: Self-pay

## 2024-06-26 DIAGNOSIS — M24549 Contracture, unspecified hand: Secondary | ICD-10-CM

## 2024-07-31 ENCOUNTER — Other Ambulatory Visit: Payer: Self-pay | Admitting: Orthopedic Surgery

## 2024-07-31 ENCOUNTER — Encounter: Payer: Self-pay | Admitting: Radiology

## 2024-07-31 DIAGNOSIS — M24549 Contracture, unspecified hand: Secondary | ICD-10-CM

## 2024-07-31 DIAGNOSIS — M720 Palmar fascial fibromatosis [Dupuytren]: Secondary | ICD-10-CM

## 2024-08-05 NOTE — Progress Notes (Unsigned)
 Trevor Sosa - 43 y.o. male MRN [id number]  Date of birth: 1980-10-24  Office Visit Note: Visit Date: 08/07/2024 PCP: Severa Rock HERO, FNP Referred by: Severa Rock HERO, FNP  Subjective: No chief complaint on file.  HPI: Trevor Sosa is a pleasant 43 y.o. male who returns today for Xiaflex injection of left ring finger Dupuytren's contracture of the MCP progressive in nature over the past few years.  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  Unsure of any specific family history.  Has not undergone any prior treatments.  Pertinent ROS were reviewed with the patient and found to be negative unless otherwise specified above in HPI.    Assessment & Plan: Visit Diagnoses:  No diagnosis found.   Plan: After discussion, we proceeded with Xiaflex injection to the left hand ring finger MP contracture.  Risks and benefits of the Xiaflex injection were discussed in detail as well as postprocedural splinting and occupational therapy.  We discussed the potential for skin tearing in particular as well.  Understanding the risk and benefits, patient would like to proceed.    Injection performed today without incident.  Patient will return in 3 days for subsequent manipulation and splinting with occupational therapy.***  Follow-up: No follow-ups on file.   Meds & Orders: No orders of the defined types were placed in this encounter.   No orders of the defined types were placed in this encounter.    Procedures: No procedures performed      Clinical History: No specialty comments available.  He reports that he has been smoking cigarettes. He has a 21 pack-year smoking history. He has never used smokeless tobacco. No results for input(s): HGBA1C, LABURIC in the last 8760 hours.  Objective:   Vital Signs: There were no vitals taken for this visit.  Physical Exam  Gen: Well-appearing, in no acute distress;  non-toxic CV: Regular Rate. Well-perfused. Warm.  Resp: Breathing unlabored on room air; no wheezing. Psych: Fluid speech in conversation; appropriate affect; normal thought process  Ortho Exam Left hand: - Palpable cord in line with the ring finger, MP contracture 40 degrees, not passively correctable, positive tabletop test   Imaging: No results found.   Past Medical/Family/Surgical/Social History: Medications & Allergies reviewed per EMR, new medications updated. Patient Active Problem List   Diagnosis Date Noted   Dupuytren's contracture of both hands 05/11/2023   Depression, recurrent 01/15/2022   GAD (generalized anxiety disorder) 01/15/2022   Primary hypertension 01/09/2021   Heavy alcohol consumption 01/09/2021   Tobacco use disorder 01/09/2021   Past Medical History:  Diagnosis Date   Allergy to alpha-gal    No pertinent past medical history    Family History  Problem Relation Age of Onset   Hyperlipidemia Mother    Diabetes Mother    Hypertension Mother    Hypertension Maternal Grandmother    Diabetes Maternal Grandmother    Past Surgical History:  Procedure Laterality Date   CYSTECTOMY     Face, Chest, L knee     Social History   Occupational History   Not on file  Tobacco Use   Smoking status: Every Day    Current packs/day: 1.00    Average packs/day: 1 pack/day for 21.0 years (21.0 ttl pk-yrs)    Types: Cigarettes   Smokeless tobacco: Never  Vaping Use   Vaping status: Never Used  Substance and Sexual Activity   Alcohol  use: Yes    Alcohol/week: 48.0 standard drinks of alcohol    Types: 48 Cans of beer per week    Comment: occ. use   Drug use: No   Sexual activity: Yes    Birth control/protection: None    Shekera Beavers Estela) Rohini Jaroszewski, M.D. [facility], Hand Surgery

## 2024-08-07 ENCOUNTER — Ambulatory Visit: Payer: MEDICAID | Admitting: Orthopedic Surgery

## 2024-08-07 ENCOUNTER — Telehealth: Payer: Self-pay | Admitting: Orthopedic Surgery

## 2024-08-07 DIAGNOSIS — M24542 Contracture, left hand: Secondary | ICD-10-CM

## 2024-08-07 DIAGNOSIS — M720 Palmar fascial fibromatosis [Dupuytren]: Secondary | ICD-10-CM | POA: Diagnosis not present

## 2024-08-07 DIAGNOSIS — M24549 Contracture, unspecified hand: Secondary | ICD-10-CM

## 2024-08-07 MED ORDER — COLLAGENASE CLOSTRID HISTOLYT 0.9 MG IJ SOLR
0.9000 mg | Freq: Once | INTRAMUSCULAR | Status: AC
  Administered 2024-08-07: 0.9 mg via INTRALESIONAL

## 2024-08-07 NOTE — Telephone Encounter (Signed)
 Pt states the hand is swollen and not sure if it should be this soon after the injection

## 2024-08-07 NOTE — Telephone Encounter (Signed)
 Spoke with patient/spouse regarding this. Explained it is normal for the swelling, that is part of injection doing what it needs to for the tendon.  Told her to utilize mychart for any other questions/concerns or if they would like to send pictures if swelling gets worse

## 2024-08-09 NOTE — Progress Notes (Signed)
 Trevor Sosa - 43 y.o. male MRN [id number]  Date of birth: [birth year]  Office Visit Note: Visit Date: 08/10/2024 PCP: Severa Rock HERO, FNP Referred by: Severa Rock HERO, FNP  Subjective: No chief complaint on file.  HPI: Trevor Sosa is a pleasant 43 y.o. male who returns today for manipulation status post previous Xiaflex injection of left ring finger Dupuytren's contracture of the MCP progressive in nature over the past few years.  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  Unsure of any specific family history.  Has not undergone any prior treatments.  Pertinent ROS were reviewed with the patient and found to be negative unless otherwise specified above in HPI.    Assessment & Plan: Visit Diagnoses:  1. Dupuytren's contracture of left hand [M72.0]      Plan: Manipulation performed successfully today with complete correction of the MP contracture to neutral.  He will be seen by occupational therapy today for fabrication of an orthosis for full-time use for the next 2 weeks.  Follow-up: No follow-ups on file.   Meds & Orders: No orders of the defined types were placed in this encounter.   No orders of the defined types were placed in this encounter.    Procedures: Left ring finger was sterilized in orthopedic fashion.  8 cc of 1% lidocaine plain was utilized for anesthetic purposes.  Once appropriate analgesia was achieved, manipulation was performed of the ring finger MP contracture.  Audible snap was appreciated with complete correction of the MP contracture to neutral.  No significant skin tearing was appreciated.  Patient was able to flex and extend the digit after manipulation.  Sterile dressings were applied.  Patient was subsequently sent to occupational therapy for fabrication of an orthosis.     Clinical History: No specialty comments available.  He reports that he has been smoking cigarettes.  He has a 21 pack-year smoking history. He has never used smokeless tobacco. No results for input(s): HGBA1C, LABURIC in the last 8760 hours.  Objective:   Vital Signs: There were no vitals taken for this visit.  Physical Exam  Gen: Well-appearing, in no acute distress; non-toxic CV: Regular Rate. Well-perfused. Warm.  Resp: Breathing unlabored on room air; no wheezing. Psych: Fluid speech in conversation; appropriate affect; normal thought process  Ortho Exam Left hand: Premanipulation - Palpable cord in line with the ring finger, MP contracture 40 degrees, not passively correctable, positive tabletop test  Left hand: Postmanipulation - Correction to neutral of the MP contracture, negative tabletop test, skin is intact, able to perform flexion/extension of the digit, digit remains warm well-perfused   Imaging: No results found.   Past Medical/Family/Surgical/Social History: Medications & Allergies reviewed per EMR, new medications updated. Patient Active Problem List   Diagnosis Date Noted   Dupuytren's contracture of both hands 05/11/2023   Depression, recurrent 01/15/2022   GAD (generalized anxiety disorder) 01/15/2022   Primary hypertension 01/09/2021   Heavy alcohol consumption 01/09/2021   Tobacco use disorder 01/09/2021   Past Medical History:  Diagnosis Date   Allergy to alpha-gal    No pertinent past medical history    Family History  Problem Relation Age of Onset   Hyperlipidemia Mother    Diabetes Mother    Hypertension Mother    Hypertension Maternal Grandmother    Diabetes Maternal Grandmother    Past Surgical History:  Procedure Laterality Date   CYSTECTOMY  Face, Chest, L knee     Social History   Occupational History   Not on file  Tobacco Use   Smoking status: Every Day    Current packs/day: 1.00    Average packs/day: 1 pack/day for 21.0 years (21.0 ttl pk-yrs)    Types: Cigarettes   Smokeless tobacco: Never  Vaping Use   Vaping  status: Never Used  Substance and Sexual Activity   Alcohol use: Yes    Alcohol/week: 48.0 standard drinks of alcohol    Types: 48 Cans of beer per week    Comment: occ. use   Drug use: No   Sexual activity: Yes    Birth control/protection: None    Aarib Pulido Estela) Yovanni Frenette, M.D. [facility], Hand Surgery

## 2024-08-10 ENCOUNTER — Other Ambulatory Visit: Payer: Self-pay

## 2024-08-10 ENCOUNTER — Encounter: Payer: Self-pay | Admitting: Occupational Therapy

## 2024-08-10 ENCOUNTER — Ambulatory Visit: Payer: MEDICAID | Attending: Orthopedic Surgery | Admitting: Occupational Therapy

## 2024-08-10 ENCOUNTER — Ambulatory Visit (INDEPENDENT_AMBULATORY_CARE_PROVIDER_SITE_OTHER): Payer: MEDICAID | Admitting: Orthopedic Surgery

## 2024-08-10 DIAGNOSIS — M720 Palmar fascial fibromatosis [Dupuytren]: Secondary | ICD-10-CM | POA: Diagnosis not present

## 2024-08-10 DIAGNOSIS — M25642 Stiffness of left hand, not elsewhere classified: Secondary | ICD-10-CM | POA: Insufficient documentation

## 2024-08-10 DIAGNOSIS — R208 Other disturbances of skin sensation: Secondary | ICD-10-CM | POA: Insufficient documentation

## 2024-08-10 DIAGNOSIS — R29898 Other symptoms and signs involving the musculoskeletal system: Secondary | ICD-10-CM | POA: Insufficient documentation

## 2024-08-10 DIAGNOSIS — R278 Other lack of coordination: Secondary | ICD-10-CM | POA: Insufficient documentation

## 2024-08-10 DIAGNOSIS — M6281 Muscle weakness (generalized): Secondary | ICD-10-CM | POA: Insufficient documentation

## 2024-08-10 DIAGNOSIS — M24549 Contracture, unspecified hand: Secondary | ICD-10-CM | POA: Insufficient documentation

## 2024-08-10 NOTE — Therapy (Signed)
 OUTPATIENT OCCUPATIONAL THERAPY ORTHO EVALUATION  Patient Name: Trevor Sosa MRN: [id number] DOB:Nov 14, 1980, 43 y.o., male Today's Date: 08/10/2024  PCP: Severa Rock HERO, FNP REFERRING PROVIDER: Arlinda Buster, MD   END OF SESSION:  OT End of Session - 08/10/24 1017     Visit Number 1    Number of Visits 10    Date for Recertification  09/27/24    Authorization Type [employer] 2025    Authorization Time Period VL: 27 AUTH REQUIRED    OT Start Time 1019    OT Stop Time 1145    OT Time Calculation (min) 86 min    Equipment Utilized During Treatment Thermoplastic materaial    Activity Tolerance Patient tolerated treatment well    Behavior During Therapy WFL for tasks assessed/performed          Past Medical History:  Diagnosis Date   Allergy to alpha-gal    No pertinent past medical history    Past Surgical History:  Procedure Laterality Date   CYSTECTOMY     Face, Chest, L knee     Patient Active Problem List   Diagnosis Date Noted   Dupuytren's contracture of both hands 05/11/2023   Depression, recurrent 01/15/2022   GAD (generalized anxiety disorder) 01/15/2022   Primary hypertension 01/09/2021   Heavy alcohol consumption 01/09/2021   Tobacco use disorder 01/09/2021    ONSET DATE: 07/31/2024   REFERRING DIAG: M24.549 (ICD-10-CM) - Contracture of hand  M72.0 (ICD-10-CM) - Dupuytren contracture of both hands   THERAPY DIAG:  Finger stiffness, left  Muscle weakness (generalized)  Other lack of coordination  Other symptoms and signs involving the musculoskeletal system  Other disturbances of skin sensation  Rationale for Evaluation and Treatment: Rehabilitation  SUBJECTIVE:   SUBJECTIVE STATEMENT: Pt arrived to OT appt s/p manipulation of his Dupuytren contracture s/p Xiaflex injection earlier this week and manipulation by MD this morning.  Pt reports about a 10 year history of issues with his hand and also has Dupuytren's developing in his R  hand although not as bad as the L hand. He does report some numbness in his fingertips s/p the injection for his procedure this morning.  Pt accompanied by: self and significant other - [name]  PERTINENT HISTORY: ~ 10+ years of issues with his hands, dx as Dupuytren contracture L>R and therefore had injection eariler  this week and manipulation today for LUE as it was interfering with his daily activities.  Pt has history of HTN, anxiety, depression, tobacco/alcohol use.   PRECAUTIONS: Other: LUE splint  RED FLAGS: None   WEIGHT BEARING RESTRICTIONS: Yes  Avoid activities that require forceful gripping a minimum of 4 wks post-op   PAIN:  Are you having pain? Yes: NPRS scale: 2 Pain location: at site of injection Pain description: sore Aggravating factors: stretching Relieving factors: rest  FALLS: Has patient fallen in last 6 months? No  LIVING ENVIRONMENT: Lives with: lives with their family and lives with their spouse - has 4 children up to 26yo Lives in: Mobile home Stairs: Ramp Has following equipment at home: Ramped entry  PLOF: Independent, working in event organiser (remodel houses, build decks), using L hand for driving  PATIENT GOALS: Get back to work, driving etc  NEXT MD VISIT: 09/07/24  OBJECTIVE:  Note: Objective measures were completed at Evaluation unless otherwise noted.  HAND DOMINANCE: Right  ADLs: WFL  FUNCTIONAL OUTCOME MEASURES: Quick Dash: 25.0   UPPER EXTREMITY ROM:     Active ROM Right eval  Left eval  Shoulder flexion [facility]  Shoulder abduction    Shoulder adduction    Shoulder extension    Shoulder internal rotation    Shoulder external rotation    Elbow flexion [facility] WFL  Elbow extension    Wrist flexion    Wrist extension  Limited  Wrist ulnar deviation    Wrist radial deviation    Wrist pronation    Wrist supination    (Blank rows = not tested)  Active ROM Right eval Left eval  Thumb MCP (0-60)    Thumb IP (0-80)    Thumb  Radial abd/add (0-55)     Thumb Palmar abd/add (0-45)     Thumb Opposition to Small Finger     Index MCP (0-90)     Index PIP (0-100)     Index DIP (0-70)      Long MCP (0-90) *  *   Long PIP (0-100)      Long DIP (0-70)      Ring MCP (0-90) *  *   Ring PIP (0-100)      Ring DIP (0-70)      Little MCP (0-90) *   *  Little PIP (0-100)      Little DIP (0-70)      * *Pt has limitations in all finger joints with measurements to be taken a next appt.  MCP joints do not have any extension at eval. (Blank rows = not tested)   UPPER EXTREMITY MMT:     MMT Right eval Left eval  Shoulder flexion 5 5  Shoulder abduction    Shoulder adduction    Shoulder extension    Shoulder internal rotation    Shoulder external rotation    Middle trapezius    Lower trapezius    Elbow flexion 5 5  Elbow extension    Wrist flexion    Wrist extension    Wrist ulnar deviation    Wrist radial deviation    Wrist pronation    Wrist supination    (Blank rows = not tested)  HAND FUNCTION: Grip strength: Right: TBA lbs; Left: NT lbs  COORDINATION: TBA  SENSATION: Numbness in fingertips s/p manipualtion  EDEMA: L hand  COGNITION: Overall cognitive status: 9th grade education  VISION  R eye - bottom half of vision is dark and top is blurry   OBSERVATIONS: Pt ambulates with no AE and no loss of balance. The pt kept his sunglasses on for the duration of treatment and reports that the 'paint' bothers his eyes since he had Covid several years ago.  Pt has obvious Dupuytren cording in both hands with swelling of L hand and digits.  TREATMENT DATE: 08/10/24                                                                                                                            Fabricated and fitted hand based splint with L ring finger in as  much extension as able per protocol. Included pinky and long finger for comfort and to prevent tightness in adjacent finger. Issued splint and reviewed wear  and care. Pt instructed to wear b/t exercises and at night.    Pt issued HEP for Dupuytrens contracture s/p xiaflex injection per [place]  Protocol  (5th edition) pg 35 and 36 including all A/AROM exercises with instruction to only gently assist with flexion/extension.  Pt return demo of each   Reviewed hygiene care, precautions and provided additional compression finger and hand stockinettes and to help with edema and extra straps in case of need of replacement.    PATIENT EDUCATION: Education details: OT role and POC Considerations, splint wear and initial UE ROM HEP Person educated: Patient and Spouse Education method: Explanation, Demonstration, Tactile cues, Verbal cues, and Handouts Education comprehension: verbalized understanding, returned demonstration, verbal cues required, tactile cues required, and needs further education  HOME EXERCISE PROGRAM: 08/10/24: [place]  Protocol s/p Xiaflex HEP (pgs 35-36)  GOALS: Goals reviewed with patient? Yes  SHORT TERM GOALS: Target date: 09/01/24   Independent with splint wear and care Baseline: issued at eval, but may need adjustments Goal status: IN Progress   2.  Independent with ROM HEP  Baseline: Issued but will need updates to include AA/ROM and gentle P/ROM Goal status: IN Progress   3.  Keep pain less than or equal to 2/10 with exercises Baseline: 2/10 s/p numbing for procedure Goal status: INITIAL   4.  Pt will verbalize understanding of edema management techniques Baseline: introduced at eval Goal status: INITIAL     LONG TERM GOALS: Target date: 09/25/23   Independent with updated strengthening HEP Baseline: Not yet issued d/t current precautions Goal status: INITIAL   2.  Pt to demo improvement in Rt ring finger ROM for max extension and full finger MP flexion to WNL to make full composite fist. Baseline: TBA Goal status: INITIAL   3.  Pt to return to using bimanual work tasks with LUE as stabilizer carpentry  tasks Baseline: Modified to using Lt hand d/t current precautions Goal status: INITIAL   4.  Patient will demonstrate at least 16% improvement with quick Dash score (reporting <10.0% disability or less) indicating improved functional use of affected extremity. Baseline: 25.0% Goal status: INITIAL   5.  Grip strength Lt hand to be 50 lbs or greater for opening tight jars/containers Baseline: not assessed d/t current precautions Goal status: INITIAL  ASSESSMENT:  CLINICAL IMPRESSION: Patient is a 43 y.o. male who was seen today for occupational therapy evaluation for Dupuytren contracture. Hx includes HTN, and tobacco use. Patient currently presents below baseline level of function and demonstrating functional deficits and impairments as noted below. Pt would benefit from skilled OT services in the outpatient setting to work on impairments as noted below to help pt return to PLOF as able.     PERFORMANCE DEFICITS: in functional skills including ADLs, IADLs, coordination, dexterity, sensation, edema, tone, ROM, strength, pain, fascial restrictions, muscle spasms, flexibility, Fine motor control, body mechanics, endurance, decreased knowledge of precautions, decreased knowledge of use of DME, wound, skin integrity, vision, and UE functional use, cognitive skills including learn, problem solving, and safety awareness, and psychosocial skills including coping strategies, environmental adaptation, and routines and behaviors.   IMPAIRMENTS: are limiting patient from ADLs, IADLs, and work.   COMORBIDITIES: may have co-morbidities  that affects occupational performance. Patient will benefit from skilled OT to address above impairments and improve overall function.  MODIFICATION OR ASSISTANCE TO  COMPLETE EVALUATION: Min-Moderate modification of tasks or assist with assess necessary to complete an evaluation.  OT OCCUPATIONAL PROFILE AND HISTORY: Detailed assessment: Review of records and additional  review of physical, cognitive, psychosocial history related to current functional performance.  CLINICAL DECISION MAKING: Moderate - several treatment options, min-mod task modification necessary  REHAB POTENTIAL: Excellent  EVALUATION COMPLEXITY: Moderate      PLAN:  OT FREQUENCY: 1-2x/week  OT DURATION: 8 weeks  PLANNED INTERVENTIONS: 97168 OT Re-evaluation, 97535 self care/ADL training, 02889 therapeutic exercise, 97530 therapeutic activity, 97140 manual therapy, 97035 ultrasound, 97018 paraffin, 02960 fluidotherapy, 97010 moist heat, 97010 cryotherapy, 97760 Orthotic Initial, 97763 Orthotic/Prosthetic subsequent, scar mobilization, passive range of motion, energy conservation, coping strategies training, patient/family education, and DME and/or AE instructions  RECOMMENDED OTHER SERVICES: NA  CONSULTED AND AGREED WITH PLAN OF CARE: Patient and family member/caregiver  PLAN FOR NEXT SESSION:  Check Splint Assess ROM  Progress HEP Protocol pp 144-145 HEP pp 35-36   Clarita LITTIE Pride, OT 08/10/2024, 4:03 PM

## 2024-08-14 ENCOUNTER — Ambulatory Visit: Payer: MEDICAID

## 2024-08-14 DIAGNOSIS — M25642 Stiffness of left hand, not elsewhere classified: Secondary | ICD-10-CM

## 2024-08-14 DIAGNOSIS — M6281 Muscle weakness (generalized): Secondary | ICD-10-CM

## 2024-08-14 DIAGNOSIS — R278 Other lack of coordination: Secondary | ICD-10-CM

## 2024-08-14 DIAGNOSIS — R29898 Other symptoms and signs involving the musculoskeletal system: Secondary | ICD-10-CM

## 2024-08-14 NOTE — Therapy (Signed)
 OUTPATIENT OCCUPATIONAL THERAPY ORTHO TREATMENT  Patient Name: Trevor Sosa MRN: [id number] DOB:1981/04/13, 43 y.o., male Today's Date: 08/14/2024  PCP: Severa Rock HERO, FNP REFERRING PROVIDER: Arlinda Buster, MD   END OF SESSION:  OT End of Session - 08/14/24 1505     Visit Number 2    Number of Visits 10    Date for Recertification  09/27/24    Authorization Type [employer] 2025    Authorization Time Period 08/14/24-02/10/25    Authorization - Visit Number 1    Authorization - Number of Visits 12    OT Start Time 1450    OT Stop Time 1530    OT Time Calculation (min) 40 min    Activity Tolerance Patient tolerated treatment well    Behavior During Therapy WFL for tasks assessed/performed           Past Medical History:  Diagnosis Date   Allergy to alpha-gal    No pertinent past medical history    Past Surgical History:  Procedure Laterality Date   CYSTECTOMY     Face, Chest, L knee     Patient Active Problem List   Diagnosis Date Noted   Dupuytren's contracture of both hands 05/11/2023   Depression, recurrent 01/15/2022   GAD (generalized anxiety disorder) 01/15/2022   Primary hypertension 01/09/2021   Heavy alcohol consumption 01/09/2021   Tobacco use disorder 01/09/2021    ONSET DATE: 07/31/2024   REFERRING DIAG: M24.549 (ICD-10-CM) - Contracture of hand  M72.0 (ICD-10-CM) - Dupuytren contracture of both hands   THERAPY DIAG:  Finger stiffness, left  Muscle weakness (generalized)  Other lack of coordination  Other symptoms and signs involving the musculoskeletal system  Rationale for Evaluation and Treatment: Rehabilitation  SUBJECTIVE:   SUBJECTIVE STATEMENT: Pt arrived to OT appt s/p manipulation of his Dupuytren contracture s/p Xiaflex injection earlier this week and manipulation by MD. Pt reports the splint is fine but a little looser since the edema in his hand has gone down.   Pt accompanied by: self and significant other -  [name]  PERTINENT HISTORY: ~ 10+ years of issues with his hands, dx as Dupuytren contracture L>R and therefore had injection eariler  this week and manipulation today for LUE as it was interfering with his daily activities.  Pt has history of HTN, anxiety, depression, tobacco/alcohol use.   PRECAUTIONS: Other: LUE splint  RED FLAGS: None   WEIGHT BEARING RESTRICTIONS: Yes  Avoid activities that require forceful gripping a minimum of 4 wks post-op   PAIN:  Are you having pain? No  FALLS: Has patient fallen in last 6 months? No  LIVING ENVIRONMENT: Lives with: lives with their family and lives with their spouse - has 4 children up to 26yo Lives in: Mobile home Stairs: Ramp Has following equipment at home: Ramped entry  PLOF: Independent, working in event organiser (remodel houses, build decks), using L hand for driving  PATIENT GOALS: Get back to work, driving etc  NEXT MD VISIT: 09/07/24  OBJECTIVE:  Note: Objective measures were completed at Evaluation unless otherwise noted.  HAND DOMINANCE: Right  ADLs: WFL  FUNCTIONAL OUTCOME MEASURES: Quick Dash: 25.0   UPPER EXTREMITY ROM:     Active ROM Right eval Left eval  Shoulder flexion [facility]  Shoulder abduction    Shoulder adduction    Shoulder extension    Shoulder internal rotation    Shoulder external rotation    Elbow flexion [facility] WFL  Elbow extension    Wrist  flexion    Wrist extension  Limited  Wrist ulnar deviation    Wrist radial deviation    Wrist pronation    Wrist supination    (Blank rows = not tested)  Active ROM Right eval Left eval  Thumb MCP (0-60)    Thumb IP (0-80)    Thumb Radial abd/add (0-55)     Thumb Palmar abd/add (0-45)     Thumb Opposition to Small Finger     Index MCP (0-90)     Index PIP (0-100)     Index DIP (0-70)      Long MCP (0-90) *  65 degrees flexion, -10 extension  Long PIP (0-100)      Long DIP (0-70)      Ring MCP (0-90) *  60 degrees flexion, -15 extension   Ring  PIP (0-100)      Ring DIP (0-70)      Little MCP (0-90) *   0-85 flexion 0 degrees extension  Little PIP (0-100)      Little DIP (0-70)      * *Pt has limitations in all finger joints with measurements to be taken a next appt.  MCP joints do not have any extension at eval. (Blank rows = not tested)   UPPER EXTREMITY MMT:     MMT Right eval Left eval  Shoulder flexion 5 5  Shoulder abduction    Shoulder adduction    Shoulder extension    Shoulder internal rotation    Shoulder external rotation    Middle trapezius    Lower trapezius    Elbow flexion 5 5  Elbow extension    Wrist flexion    Wrist extension    Wrist ulnar deviation    Wrist radial deviation    Wrist pronation    Wrist supination    (Blank rows = not tested)  HAND FUNCTION: Grip strength: Right: TBA lbs; Left: NT lbs  COORDINATION: TBA  SENSATION: Numbness in fingertips s/p manipualtion  EDEMA: L hand  COGNITION: Overall cognitive status: 9th grade education  VISION  R eye - bottom half of vision is dark and top is blurry   OBSERVATIONS: Pt ambulates with no AE and no loss of balance. The pt kept his sunglasses on for the duration of treatment and reports that the 'paint' bothers his eyes since he had Covid several years ago.  Pt has obvious Dupuytren cording in both hands with swelling of L hand and digits.  TREATMENT DATE: 08/14/24                                                                                                                           Checked pt's splint, reported that it's a little looser d/t edema in hand going down. Made minor adjustment to straps for improved comfort and reduced looseness.   Reviewed IHP exercises per pgs 35-36, pt required min cues to hold each end position for a couple seconds to  promote improved stretch.    Informed pt that 10-14 days post injection he is able to wean from splint during the day, wearing 3-4 times a day for an hour each time but continue  to wear at night per [place]  Hand Protocol pg. 144. Reviewed edema mgmt techniques, with education in purpose of retrograde massage and massaging from the finger tip down when his fingers swell.   Educated pt in pen hurdles, completing individual digit extension and abduction to clear a pen on tabletop. PATIENT EDUCATION: Education details: review of HEP, purpose of obtaining ROM Person educated: Patient and Spouse Education method: Explanation, Demonstration, Tactile cues, Verbal cues, and Handouts Education comprehension: verbalized understanding, returned demonstration, verbal cues required, tactile cues required, and needs further education  HOME EXERCISE PROGRAM: 08/10/24: [place]  Protocol s/p Xiaflex HEP (pgs 35-36)  GOALS: Goals reviewed with patient? Yes  SHORT TERM GOALS: Target date: 09/01/24   Independent with splint wear and care Baseline: issued at eval, but may need adjustments Goal status: IN Progress   2.  Independent with ROM HEP  Baseline: Issued but will need updates to include AA/ROM and gentle P/ROM Goal status: IN Progress   3.  Keep pain less than or equal to 2/10 with exercises Baseline: 2/10 s/p numbing for procedure Goal status: INITIAL   4.  Pt will verbalize understanding of edema management techniques Baseline: introduced at eval Goal status: INITIAL     LONG TERM GOALS: Target date: 09/25/23   Independent with updated strengthening HEP Baseline: Not yet issued d/t current precautions Goal status: INITIAL   2.  Pt to demo improvement in Rt ring finger ROM for max extension and full finger MP flexion to WNL to make full composite fist. Baseline: TBA Goal status: INITIAL   3.  Pt to return to using bimanual work tasks with LUE as stabilizer carpentry tasks Baseline: Modified to using Lt hand d/t current precautions Goal status: INITIAL   4.  Patient will demonstrate at least 16% improvement with quick Dash score (reporting <10.0% disability or  less) indicating improved functional use of affected extremity. Baseline: 25.0% Goal status: INITIAL   5.  Grip strength Lt hand to be 50 lbs or greater for opening tight jars/containers Baseline: not assessed d/t current precautions Goal status: INITIAL  ASSESSMENT:  CLINICAL IMPRESSION: Patient is a 43 y.o. male who was seen today for occupational therapy tx for Dupuytren contracture. Hx includes HTN, and tobacco use. Participating well with HEPs and good carryover noted at home. Patient currently presents below baseline level of function and demonstrating functional deficits and impairments as noted below. Pt would benefit from continued skilled OT services in the outpatient setting to work on impairments as noted below to help pt return to PLOF as able.     PERFORMANCE DEFICITS: in functional skills including ADLs, IADLs, coordination, dexterity, sensation, edema, tone, ROM, strength, pain, fascial restrictions, muscle spasms, flexibility, Fine motor control, body mechanics, endurance, decreased knowledge of precautions, decreased knowledge of use of DME, wound, skin integrity, vision, and UE functional use, cognitive skills including learn, problem solving, and safety awareness, and psychosocial skills including coping strategies, environmental adaptation, and routines and behaviors.   IMPAIRMENTS: are limiting patient from ADLs, IADLs, and work.   COMORBIDITIES: may have co-morbidities  that affects occupational performance. Patient will benefit from skilled OT to address above impairments and improve overall function.  MODIFICATION OR ASSISTANCE TO COMPLETE EVALUATION: Min-Moderate modification of tasks or assist with assess necessary to complete an evaluation.  OT OCCUPATIONAL PROFILE AND HISTORY: Detailed assessment: Review of records and additional review of physical, cognitive, psychosocial history related to current functional performance.  CLINICAL DECISION MAKING: Moderate -  several treatment options, min-mod task modification necessary  REHAB POTENTIAL: Excellent  EVALUATION COMPLEXITY: Moderate      PLAN:  OT FREQUENCY: 1-2x/week  OT DURATION: 8 weeks  PLANNED INTERVENTIONS: 97168 OT Re-evaluation, 97535 self care/ADL training, 02889 therapeutic exercise, 97530 therapeutic activity, 97140 manual therapy, 97035 ultrasound, 97018 paraffin, 02960 fluidotherapy, 97010 moist heat, 97010 cryotherapy, 97760 Orthotic Initial, 97763 Orthotic/Prosthetic subsequent, scar mobilization, passive range of motion, energy conservation, coping strategies training, patient/family education, and DME and/or AE instructions  RECOMMENDED OTHER SERVICES: NA  CONSULTED AND AGREED WITH PLAN OF CARE: Patient and family member/caregiver  PLAN FOR NEXT SESSION:  Check Splint Assess ROM  Progress HEP Protocol pp 144-145 HEP pp 35-36  11/10 injection date-08/14/24 tx date: pt is currently 7 days out from injection. Rocky Dutch, OT 08/14/2024, 3:32 PM

## 2024-08-29 ENCOUNTER — Ambulatory Visit: Payer: MEDICAID | Admitting: Occupational Therapy

## 2024-09-05 ENCOUNTER — Ambulatory Visit: Payer: MEDICAID

## 2024-09-07 ENCOUNTER — Ambulatory Visit: Payer: MEDICAID | Admitting: Orthopedic Surgery

## 2024-09-12 ENCOUNTER — Ambulatory Visit: Payer: Self-pay | Admitting: Occupational Therapy

## 2024-09-15 ENCOUNTER — Ambulatory Visit: Payer: MEDICAID

## 2024-09-15 DIAGNOSIS — M6281 Muscle weakness (generalized): Secondary | ICD-10-CM | POA: Diagnosis present

## 2024-09-15 DIAGNOSIS — M25642 Stiffness of left hand, not elsewhere classified: Secondary | ICD-10-CM | POA: Insufficient documentation

## 2024-09-15 DIAGNOSIS — R29898 Other symptoms and signs involving the musculoskeletal system: Secondary | ICD-10-CM | POA: Diagnosis present

## 2024-09-15 DIAGNOSIS — R278 Other lack of coordination: Secondary | ICD-10-CM | POA: Diagnosis present

## 2024-09-15 DIAGNOSIS — R208 Other disturbances of skin sensation: Secondary | ICD-10-CM | POA: Insufficient documentation

## 2024-09-15 NOTE — Therapy (Signed)
 " OUTPATIENT OCCUPATIONAL THERAPY ORTHO TREATMENT AND DISCHARGE  Patient Name: Trevor Sosa MRN: [id number] DOB:[birth year], 43 y.o., male Today's Date: 09/15/2024  PCP: Severa Rock HERO, FNP REFERRING PROVIDER: Arlinda Buster, MD  OCCUPATIONAL THERAPY DISCHARGE SUMMARY  Visits from Start of Care: Including eval pt received 3 visits  Current functional level related to goals / functional outcomes: Pt has met 4/4 short term goal and 5/5 long term goals to date.     Remaining deficits: Occasional morning stiffness in joints   Education / Equipment: Educated in HEPs to improve stiffness and ROM in hand, modalities to reduce pain and stiffness,   Patient agrees to discharge. Patient goals were met. Patient is being discharged due to meeting the stated rehab goals..    END OF SESSION:  OT End of Session - 09/15/24 1444     Visit Number 3    Number of Visits 10    Date for Recertification  09/27/24    Authorization Type [employer] 2025    Authorization Time Period 08/14/24-02/10/25    Authorization - Visit Number 2    Authorization - Number of Visits 12    OT Start Time 1405   pt was 5 minutes late   OT Stop Time 1443    OT Time Calculation (min) 38 min    Equipment Utilized During Treatment green theraputty, testing materials    Activity Tolerance Patient tolerated treatment well    Behavior During Therapy WFL for tasks assessed/performed            Past Medical History:  Diagnosis Date   Allergy to alpha-gal    No pertinent past medical history    Past Surgical History:  Procedure Laterality Date   CYSTECTOMY     Face, Chest, L knee     Patient Active Problem List   Diagnosis Date Noted   Dupuytren's contracture of both hands 05/11/2023   Depression, recurrent 01/15/2022   GAD (generalized anxiety disorder) 01/15/2022   Primary hypertension 01/09/2021   Heavy alcohol consumption 01/09/2021   Tobacco use disorder 01/09/2021    ONSET DATE: 07/31/2024    REFERRING DIAG: M24.549 (ICD-10-CM) - Contracture of hand  M72.0 (ICD-10-CM) - Dupuytren contracture of both hands   THERAPY DIAG:  Finger stiffness, left  Muscle weakness (generalized)  Other lack of coordination  Other disturbances of skin sensation  Other symptoms and signs involving the musculoskeletal system  Rationale for Evaluation and Treatment: Rehabilitation  SUBJECTIVE:   SUBJECTIVE STATEMENT: Pt reports no pain and that I feel like it's back to normal.   Pt accompanied by: self and significant other - [name]  PERTINENT HISTORY: ~ 10+ years of issues with his hands, dx as Dupuytren contracture L>R and therefore had injection eariler  this week and manipulation today for LUE as it was interfering with his daily activities.  Pt has history of HTN, anxiety, depression, tobacco/alcohol use.   PRECAUTIONS: Other: LUE splint  RED FLAGS: None   WEIGHT BEARING RESTRICTIONS: Yes  Avoid activities that require forceful gripping a minimum of 4 wks post-op   PAIN:  Are you having pain? No  FALLS: Has patient fallen in last 6 months? No  LIVING ENVIRONMENT: Lives with: lives with their family and lives with their spouse - has 4 children up to 26yo Lives in: Mobile home Stairs: Ramp Has following equipment at home: Ramped entry  PLOF: Independent, working in event organiser (remodel houses, build decks), using L hand for driving  PATIENT GOALS: Get back  to work, driving etc  NEXT MD VISIT: 09/07/24  OBJECTIVE:  Note: Objective measures were completed at Evaluation unless otherwise noted.  HAND DOMINANCE: Right  ADLs: WFL  FUNCTIONAL OUTCOME MEASURES: Quick Dash: 25.0   09/15/24:  UPPER EXTREMITY ROM:     Active ROM Right eval Left eval  Shoulder flexion [facility]  Shoulder abduction    Shoulder adduction    Shoulder extension    Shoulder internal rotation    Shoulder external rotation    Elbow flexion [facility] WFL  Elbow extension    Wrist flexion     Wrist extension  Limited  Wrist ulnar deviation    Wrist radial deviation    Wrist pronation    Wrist supination    (Blank rows = not tested)  Active ROM Right eval Left eval  Thumb MCP (0-60)    Thumb IP (0-80)    Thumb Radial abd/add (0-55)     Thumb Palmar abd/add (0-45)     Thumb Opposition to Small Finger     Index MCP (0-90)     Index PIP (0-100)     Index DIP (0-70)      Long MCP (0-90) *  65 degrees flexion, -10 extension  Long PIP (0-100)      Long DIP (0-70)      Ring MCP (0-90) *  60 degrees flexion, -15 extension   Ring PIP (0-100)      Ring DIP (0-70)      Little MCP (0-90) *   0-85 flexion 0 degrees extension  Little PIP (0-100)      Little DIP (0-70)      * *Pt has limitations in all finger joints with measurements to be taken a next appt.  MCP joints do not have any extension at eval. (Blank rows = not tested)   UPPER EXTREMITY MMT:     MMT Right eval Left eval  Shoulder flexion 5 5  Shoulder abduction    Shoulder adduction    Shoulder extension    Shoulder internal rotation    Shoulder external rotation    Middle trapezius    Lower trapezius    Elbow flexion 5 5  Elbow extension    Wrist flexion    Wrist extension    Wrist ulnar deviation    Wrist radial deviation    Wrist pronation    Wrist supination    (Blank rows = not tested)  HAND FUNCTION: Grip strength: Right: TBA lbs; Left: NT lbs 09/15/24: Left: 67.6 avg Right: 91.3 avg COORDINATION: TBA 09/15/24: 9HPT; Rt=40 sec, Lt=32 sec Box and Blocks: Rt= 39 blocks, Lt= 38 SENSATION: Numbness in fingertips s/p manipualtion  EDEMA: L hand  COGNITION: Overall cognitive status: 9th grade education  VISION  R eye - bottom half of vision is dark and top is blurry   OBSERVATIONS: Pt ambulates with no AE and no loss of balance. The pt kept his sunglasses on for the duration of treatment and reports that the 'paint' bothers his eyes since he had Covid several years ago.  Pt has  obvious Dupuytren cording in both hands with swelling of L hand and digits.  TREATMENT DATE: 09/15/24                                                                                                                           -  Self-care/home management completed for duration as noted below including:  Re-assessed pt's STG/LTG, pt has met all goals. Pt completed simulated work tasks including hammering nails into 2x4 and screwing/unscrewing screws with screwdriver. Pt reported no pain with receiving impact through hand.   Educated pt in use of heat modality for reducing stiffness in joints. See pt instructions for handout provided. Pt urged to slowly acclimate to work tasks and instructed in importance of not overdoing it. Pt verbalized understanding.  - Therapeutic exercises completed for duration as noted below including: Pt instructed in use of green theraputty in addition to IHP handout for honing grip strength prn. Pt also educated in additional exercise of removing marbles of theraputty to improve grip strength and hand dexterity to carry over with work tasks. Good understanding.   PATIENT EDUCATION: Education details: SEE ABOVE Person educated: Patient and Spouse Education method: Explanation, Demonstration, Tactile cues, Verbal cues, and Handouts Education comprehension: verbalized understanding, returned demonstration, verbal cues required, tactile cues required, and needs further education  HOME EXERCISE PROGRAM: 08/10/24: [place]  Protocol s/p Xiaflex  HEP (pgs 35-36)  GOALS: Goals reviewed with patient? Yes  SHORT TERM GOALS: Target date: 09/01/24   Independent with splint wear and care Baseline: issued at eval, but may need adjustments Goal status: met   2.  Independent with ROM HEP  Baseline: Issued but will need updates to include AA/ROM and gentle P/ROM Goal status: met   3.  Keep pain less than or equal to 2/10 with exercises Baseline: 2/10 s/p numbing for  procedure 09/15/24 Goal status: met   4.  Pt will verbalize understanding of edema management techniques Baseline: introduced at eval Goal status: met     LONG TERM GOALS: Target date: 09/25/23   Independent with updated strengthening HEP Baseline: Not yet issued d/t current precautions Goal status: met   2.  Pt to demo improvement in Rt ring finger ROM for max extension and full finger MP flexion to WNL to make full composite fist. Baseline: TBA Goal status: met   3.  Pt to return to using bimanual work tasks with LUE as stabilizer carpentry tasks Baseline: Modified to using Lt hand d/t current precautions Goal status: met   4.  Patient will demonstrate at least 16% improvement with quick Dash score (reporting <10.0% disability or less) indicating improved functional use of affected extremity. Baseline: 25.0% 09/15/24: 2.3%  Goal status: met   5.  Grip strength Lt hand to be 50 lbs or greater for opening tight jars/containers Baseline: not assessed d/t current precautions 09/15/24: Left: 67.6 avg; Right: 91.3 avg Goal status: met  ASSESSMENT:  CLINICAL IMPRESSION: Patient is appropriate for discharge and no longer demonstrates medical necessity for continued skilled occupational therapy services.  Rocky Dutch, OT 09/15/2024, 4:29 PM   "

## 2024-09-15 NOTE — Patient Instructions (Signed)
 How to Use Heat Therapy Heat therapy is the use of heat to relax your muscles. This can help with pain and muscle spasms. Using heat can help if your muscles or joints are: Sore. Stiff. Injured. Tight. What are the risks? Unless your health care provider says it's OK, do not use heat therapy if you have any of these things: New bruises. Open or healing wounds. Skin problems, such as: Infected skin. Scars in the spot being treated. Blood problems, such as: Active bleeding. Poor blood flow. Blood clots. Numbness in the spot being treated. Swelling that's not normal. Conditions, such as: Diabetes. Heart disease. Cancer. An inability to let people know when you're in pain. Young children and people who have dementia may have trouble with this. How to use heat therapy Use the heat source that your provider recommends, such as: A moist heat pack. A hot water  bottle. This should be warm but not too hot. An electric heating pad. A heated gel pack. A heated wrap. A warm water  bath. Use heat as told. In most cases, you should: Place a towel between your skin and the heat source. Leave the heat on for 20-30 minutes. Your skin may turn pink. If your skin turns red, take off the heat right away to prevent burns. The risk of burns is higher if you can't feel pain, heat, or cold. If told, you can also soak in a warm water  bath. To do so: Put a non-slip pad in the bathtub to prevent slips or falls. Fill the bathtub with warm water . Check the water  temperature. Soak in the water  for 15-20 minutes, or for as long as you're told. When you're done, carefully stand up. You may feel dizzy. After the bath, pat yourself dry. Do not rub your skin to dry it. General recommendations Be careful to avoid burns. You can get burns from: High heat. Keeping the heat on your skin for too long. Do not sleep while using heat therapy. Only use heat therapy when you're awake. Check your skin during heat  therapy. Do not use heat therapy: For new injuries. If you have swelling, you shouldn't use heat therapy. On skin that's irritated. This may be from a rash or sunburn. If your skin is red or turns red. Contact a health care provider if: You have any of these things in the spot on your body where you use heat therapy: Blisters. Redness. Swelling. Numbness. You have new pain. Your pain gets worse. This information is not intended to replace advice given to you by your health care provider. Make sure you discuss any questions you have with your health care provider. Document Revised: 04/21/2023 Document Reviewed: 04/21/2023 Elsevier Patient Education  2024 [employer].

## 2024-09-19 ENCOUNTER — Ambulatory Visit: Payer: Self-pay | Admitting: Occupational Therapy

## 2024-09-19 ENCOUNTER — Ambulatory Visit: Payer: MEDICAID

## 2024-09-26 ENCOUNTER — Ambulatory Visit: Payer: MEDICAID

## 2024-09-26 ENCOUNTER — Ambulatory Visit: Payer: Self-pay | Admitting: Occupational Therapy

## 2024-10-03 ENCOUNTER — Telehealth: Payer: Self-pay | Admitting: Family Medicine

## 2024-10-03 NOTE — Telephone Encounter (Signed)
 Copied from CRM #[id number]. Topic: General - Other >> Oct 03, 2024  3:34 PM Miquel SAILOR wrote: Reason for CRM:  Paw from [facility]/[phone]:Verifed dates 04/13/24 insurance name and memID#. Needs call back Invoice# [id number]

## 2024-10-04 NOTE — Telephone Encounter (Signed)
 For service date 04/13/2024, patient had the [facility] plan with ID# [id number]

## 2024-10-09 NOTE — Progress Notes (Unsigned)
 Left hand follow up

## 2024-10-10 ENCOUNTER — Ambulatory Visit: Payer: MEDICAID | Admitting: Orthopedic Surgery

## 2024-10-10 DIAGNOSIS — M720 Palmar fascial fibromatosis [Dupuytren]: Secondary | ICD-10-CM | POA: Diagnosis not present
